# Patient Record
Sex: Female | Born: 1935 | Race: White | Hispanic: No | Smoking: Former smoker
Health system: Southern US, Community
[De-identification: ages and names within clinical notes are randomized; demographics above are authoritative.]

## PROBLEM LIST (undated history)

## (undated) DIAGNOSIS — J45909 Unspecified asthma, uncomplicated: Secondary | ICD-10-CM

## (undated) DIAGNOSIS — S72009A Fracture of unspecified part of neck of unspecified femur, initial encounter for closed fracture: Secondary | ICD-10-CM

## (undated) DIAGNOSIS — J449 Chronic obstructive pulmonary disease, unspecified: Secondary | ICD-10-CM

## (undated) DIAGNOSIS — W19XXXA Unspecified fall, initial encounter: Secondary | ICD-10-CM

## (undated) DIAGNOSIS — E119 Type 2 diabetes mellitus without complications: Secondary | ICD-10-CM

## (undated) DIAGNOSIS — D649 Anemia, unspecified: Secondary | ICD-10-CM

## (undated) DIAGNOSIS — M1990 Unspecified osteoarthritis, unspecified site: Secondary | ICD-10-CM

## (undated) DIAGNOSIS — I10 Essential (primary) hypertension: Secondary | ICD-10-CM

## (undated) DIAGNOSIS — E785 Hyperlipidemia, unspecified: Secondary | ICD-10-CM

## (undated) DIAGNOSIS — N179 Acute kidney failure, unspecified: Secondary | ICD-10-CM

## (undated) DIAGNOSIS — R7989 Other specified abnormal findings of blood chemistry: Secondary | ICD-10-CM

## (undated) DIAGNOSIS — N184 Chronic kidney disease, stage 4 (severe): Secondary | ICD-10-CM

## (undated) DIAGNOSIS — R778 Other specified abnormalities of plasma proteins: Secondary | ICD-10-CM

## (undated) DIAGNOSIS — C801 Malignant (primary) neoplasm, unspecified: Secondary | ICD-10-CM

## (undated) HISTORY — DX: Acute kidney failure, unspecified: N17.9

## (undated) HISTORY — DX: Other specified abnormalities of plasma proteins: R77.8

## (undated) HISTORY — PX: STENT PLACEMENT VASCULAR (ARMC HX): HXRAD1737

## (undated) HISTORY — DX: Hyperlipidemia, unspecified: E78.5

## (undated) HISTORY — DX: Anemia, unspecified: D64.9

## (undated) HISTORY — DX: Unspecified fall, initial encounter: W19.XXXA

## (undated) HISTORY — DX: Chronic kidney disease, stage 4 (severe): N18.4

## (undated) HISTORY — DX: Fracture of unspecified part of neck of unspecified femur, initial encounter for closed fracture: S72.009A

## (undated) HISTORY — DX: Other specified abnormal findings of blood chemistry: R79.89

## (undated) HISTORY — PX: CHOLECYSTECTOMY: SHX55

## (undated) HISTORY — PX: FEMORAL ARTERY STENT: SHX1583

## (undated) HISTORY — PX: REPLACEMENT TOTAL KNEE: SUR1224

## (undated) HISTORY — PX: CATARACT EXTRACTION: SUR2

---
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
## 1973-05-03 HISTORY — PX: ABDOMINAL HYSTERECTOMY: SHX81

## 2008-04-29 ENCOUNTER — Inpatient Hospital Stay (HOSPITAL_COMMUNITY): Admission: EM | Admit: 2008-04-29 | Discharge: 2008-05-01 | Payer: Self-pay | Admitting: Family Medicine

## 2010-09-15 NOTE — Discharge Summary (Signed)
[name], [name]              ACCOUNT NO.:  [id number]   MEDICAL RECORD NO.:  [id number]          PATIENT TYPE:  INP   LOCATION:  4715                         FACILITY:  [facility]   PHYSICIAN:  Richarda Overlie, MD       DATE OF BIRTH:  12/18/1925   DATE OF ADMISSION:  04/29/2008  DATE OF DISCHARGE:  05/01/2008                               DISCHARGE SUMMARY   DISCHARGE DIAGNOSES:  1. Chronic obstructive pulmonary disease exacerbation.  2. Mild congestive heart failure.  3. Anemia.  4. Acute on chronic renal insufficiency.  5. Subtherapeutic INR with a history of recent deep vein thrombosis.   SUBJECTIVE:  This is an 77 year old female who presented to the ER with:  1. Chief complaint of shortness of breath, fairly acute in onset,      associated with nonproductive cough.  The patient has noted      worsening over the last one week.  The patient had pulmonary      embolism September 2009, he was found to have a subtherapeutic INR      and was admitted for adjustment of her INR and treatment of her      renal insufficiency and COPD exacerbation.  Initial chest x-ray      showed central pulmonary vascular prominence, elevation of the      right hemidiaphragm with increased markings at the right base and      some cardiomegaly.  No obvious infiltrate.  However, because of the      patient's tenuous pulmonary status  and COPD , she was treated with      moxifloxacin IV which was later switched to p.o. also.  Serial      troponins and EKG were done to rule out acute coronary syndrome      which remained negative.  Her BNP was only found to be 162.  She      was not found to be in any CHF exacerbation.  2. The patient also has a history of asthma and allergies, and      therefore, we started her on Singulair and Flagyl prior to      discharge.  3. Renal insufficiency.  The patient was found to have a creatinine of      1.7 which responded to IV hydration.  Her enalapril was held.  Her   Lasix was continued.  4. Leukocytosis secondary to steroids.  5. Hyperglycemia secondary to steroids.   DISPOSITION:  PT/OT consultation was obtained.  Ambulatory OT evaluation  was obtained.  Case manager to arrange for Lovenox injections for four  days.  Case manager also to arrange for PCP followup in 5-7 days as well  as INR check on May 12, 2008, and to set up with the Coumadin  clinic.   DISCHARGE MEDICATIONS:  1. Coumadin 5 mg daily.  2. Singulair 10 mg p.o. daily.  3. Prednisone 10 mg tablets, 6 tablets for four days, 5 tablets for      four days, 4 tablets for four days, 3 tablets for four days, 2  tablets for four days, one tablet for four days and then      discontinue.  4. Claritin 10 mg p.o. daily.  5. Lovenox 90 mg subcu q.12 h for four days.  6. Moxifloxacin 400 mg p.o. daily for four days.  7. Advair discus 500/50 one puff q.12 h.  8. Celexa 20 mg p.o. daily.  9. Darvocet N-100 mg q.8 h p.r.n.  10.Enalapril 20 mg b.i.d. (hold until 05/07/2008).  11.Garlic daily.  12.Iron over-the-counter b.i.d.  13.Klor-Con 10 mEq p.o. daily.  14.Labetalol 300 mg p.o. twice daily.  15.Lasix 20 mg p.o. daily.  16.Lovastatin 40 mg p.o. daily.  17.Lovaza one gram capsule b.i.d.  18.Metformin, hold until 05/07/2008.  19.Multivitamin one tablet daily.  20.Nitroglycerin 0.4 mg q.5 minutes p.r.n. x2 for chest pain.  21.Norvasc 2.5 mg p.o. daily.  22.Omeprazole 20 mg p.o. daily.  23.Plavix 75 mg p.o. daily.  24.Tylenol Extra Strength p.r.n. Ventolin.  25.HFA inhaler q.4-6 h p.r.n.      Richarda Overlie, MD  Electronically Signed     NA/MEDQ  D:  05/01/2008  T:  05/01/2008  Job:  [id number]

## 2010-09-15 NOTE — H&P (Signed)
[name], [name]              ACCOUNT NO.:  [id number]   MEDICAL RECORD NO.:  [id number]          PATIENT TYPE:  INP   LOCATION:  4715                         FACILITY:  [facility]   PHYSICIAN:  Della Goo, M.D. DATE OF BIRTH:  09/15/1925   DATE OF ADMISSION:  04/29/2008  DATE OF DISCHARGE:                              HISTORY & PHYSICAL   PRIMARY CARE PHYSICIAN:  Unassigned.  The patient is from out-of-town  visiting family.  She is from for [place].   CHIEF COMPLAINT:  Shortness of breath, cough.   HISTORY OF PRESENT ILLNESS:  This is an 77 year old female who presented  to the emergency department with complaints of cough, shortness of  breath and greenish phlegm for the past 3 days.  She states that she had  been given antibiotic therapy by her primary care physician prior to her  travel in case she needed this of a Z-Pak, however, her symptoms have  been worsening.  The patient states that she went to the emergency  department because of her symptoms, but also because she wanted to have  her Coumadin level checked.  The patient has a history of a pulmonary  embolism and has been on Coumadin therapy for this.  The patient does  report having chest pain discomfort and describes the pain as being  bilateral chest pain and does report having pleuritic pain.  Denies  having any hemoptysis.  The patient was evaluated in the emergency  department and found to have an elevated white blood cell count, also  found to have a subtherapeutic Coumadin level and was referred for  further treatment and therapy.  The patient was seen initially at an  [facility] and referred to the hospital.  The patient also had a  prednisone taper as an outpatient for symptoms as well.  She reports  only taking 2 days of this.   PAST MEDICAL HISTORY:  1. As mentioned above.  2. History of a pulmonary embolism.  3. History of hypertension.  4. Diastolic congestive heart failure.  5. Anemia.  6. Chronic renal insufficiency.  7. The patient also has a history of type 2 diabetes.  8. History of hyperlipidemia.  9. Gastroesophageal reflux disease.  10.The patient was diagnosed with a pulmonary embolism in July 2009.  11.The patient has a history of coronary artery disease.  12.Peripheral vascular disease.  13.History of reactive airway disease.   PAST SURGICAL HISTORY:  1. History of a total abdominal hysterectomy.  2. History of a cholecystectomy.   MEDICATIONS:  1. Coumadin 2 mg p.o. daily.  2. Plavix 75 mg 1 p.o. daily.  3. Hydrochlorothiazide 25 mg 1 p.o. daily or Lasix 20 mg 1 p.o. daily.  4. Klor-Con 8 mEq 1 tablet p.o. daily when taking Lasix.  5. Glyburide/metformin 5/500 one tablet p.o. b.i.d.  6. Labetalol 300 mg 1 p.o. b.i.d.  7. Enalapril 20 mg 1 p.o. b.i.d.  8. Lovastatin 10 mg 1 p.o. b.i.d.  9. Lovaza 1 gram 1 p.o. b.i.d.  10.Celexa 20 mg 1 p.o. daily.  11.Amlodipine 2.5 mg 1 p.o. daily.  12.Omeprazole  20 mg 1 p.o. daily.  13.Iron over-the-counter 1 tablet b.i.d.  14.Advair Diskus 500/50 one inhalation q.12 h.  15.Ventolin inhaler HFA 2 puffs daily p.r.n.  16.Darvocet N 100 one tablet p.o. q.6 h., p.r.n. pain.  17.Tylenol Extra-Strength as needed for pain.  18.Nitroglycerin 0.4 mg p.r.n. chest pain.  19.Over-the-counter garlic tablets.  20.B complex tablets.  21.Multivitamin tablet daily.   ALLERGIES:  1. PENICILLIN.  2. SULFA.   SOCIAL HISTORY:  The patient is a nonsmoker and nondrinker.   FAMILY HISTORY:  Noncontributory.   REVIEW OF SYSTEMS:  Pertinents are mentioned above.  The patient denies  having any nausea, vomiting, diarrhea, constipation, lightheadedness,  syncope, seizures, loss of appetite, weight loss or weight gain.  She  denies having any dysuria.  Pertinent positives are mentioned above, but  also the patient does report having fevers and chills off and on and  some myalgias.   PHYSICAL EXAMINATION:  GENERAL:  This is a  pleasant 77 year old younger  than stated age appearing obese female in discomfort, but no acute  distress.  VITAL SIGNS:  Temperature 99.7, blood pressure 130/76, heart rate 85,  respirations 28.  O2 saturations initially 88% on room air, now on 2  liters nasal cannula oxygen at 96%.  HEENT:  Normocephalic, atraumatic.  There is no scleral icterus or  conjunctival injection.  Pupils are equally round and reactive to light.  Extraocular movements are intact.  Funduscopic benign.  Nares are  patent.  Oropharynx is clear.  No exudates, erythema or tonsillar  adenopathy.  NECK:  Supple.  Full range of motion.  No thyromegaly, adenopathy or  jugulovenous distention.  CARDIOVASCULAR:  Regular rate and rhythm.  No  murmurs, gallops or rubs.  LUNGS:  Occasional rhonchi.  No rales, no wheezes.  ABDOMEN:  Positive bowel sounds.  Soft, nontender, nondistended.  No  hepatosplenomegaly.  EXTREMITIES:  Without cyanosis, clubbing or edema.  NEUROLOGIC:  Alert and oriented x3.  There are no focal deficits.  Motor  and sensory function are intact.  Cerebellar function intact as well.   LABORATORY STUDIES:  White blood cell count 11.8, hemoglobin 10.6,  hematocrit 32.6, platelets 179, neutrophils 84%, lymphocytes 11%.  Sodium 137, potassium 4.0, chloride 99, bicarb 33, BUN 24, creatinine  1.7 and glucose 137.  Beta natriuretic peptide 162.  Protime 15.8, INR  1.2.  Chest x-ray performed reveals an elevated right hemidiaphragm with  increased markings in the right base and cardiomegaly is present.  Central pulmonary venous prominence also present.  No evidence of  pulmonary edema.  An EKG was performed which did reveal a normal sinus  rhythm without acute ST-segment changes.   ASSESSMENT:  An 77 year old female being admitted with;  1. Shortness of breath.  2. Early pneumonia versus bronchitis.  3. Mild congestive heart failure syndrome chronically.  4. Subtherapeutic Coumadin level.  5. Anemia.   6. Chronic renal insufficiency.   PLAN:  The patient will be admitted and placed on IV antibiotic therapy  of Avelox with an IV steroid taper and nebulizer treatments along with  supplemental oxygen therapy.  Cardiac enzymes will also be started.  Full-dose Lovenox therapy will be initiated to bridge for adjustment in  her Coumadin therapy.  The patient's electrolytes, protime and INR will  be monitored.  GI prophylaxis has been ordered and the patient's regular  medications will be further verified and continued as well.      Della Goo, M.D.  Electronically Signed  HJ/MEDQ  D:  05/01/2008  T:  05/01/2008  Job:  [id number]

## 2011-02-05 LAB — BASIC METABOLIC PANEL
BUN: 26 mg/dL — ABNORMAL HIGH (ref 6–23)
Calcium: 8.3 mg/dL — ABNORMAL LOW (ref 8.4–10.5)
Calcium: 8.9 mg/dL (ref 8.4–10.5)
Creatinine, Ser: 1.27 mg/dL — ABNORMAL HIGH (ref 0.4–1.2)
Creatinine, Ser: 1.46 mg/dL — ABNORMAL HIGH (ref 0.4–1.2)
GFR calc non Af Amer: 34 mL/min — ABNORMAL LOW (ref >60)
GFR calc non Af Amer: 40 mL/min — ABNORMAL LOW (ref >60)
Glucose, Bld: 225 mg/dL — ABNORMAL HIGH (ref 70–99)
Glucose, Bld: 450 mg/dL — ABNORMAL HIGH (ref 70–99)
Potassium: 4.2 mEq/L (ref 3.5–5.1)
Sodium: 137 mEq/L (ref 135–145)

## 2011-02-05 LAB — POCT I-STAT, CHEM 8
BUN: 24 mg/dL — ABNORMAL HIGH (ref 6–23)
Calcium, Ion: 1.05 mmol/L — ABNORMAL LOW (ref 1.12–1.32)
Chloride: 99 mEq/L (ref 96–112)
Glucose, Bld: 137 mg/dL — ABNORMAL HIGH (ref 70–99)
Potassium: 4 mEq/L (ref 3.5–5.1)

## 2011-02-05 LAB — PROTIME-INR
INR: 1.2 (ref 0.00–1.49)
Prothrombin Time: 15.8 seconds — ABNORMAL HIGH (ref 11.6–15.2)
Prothrombin Time: 16 seconds — ABNORMAL HIGH (ref 11.6–15.2)

## 2011-02-05 LAB — DIFFERENTIAL
Basophils Absolute: 0 10*3/uL (ref 0.0–0.1)
Basophils Relative: 0 % (ref 0–1)
Basophils Relative: 0 % (ref 0–1)
Eosinophils Absolute: 0.1 10*3/uL (ref 0.0–0.7)
Eosinophils Relative: 0 % (ref 0–5)
Lymphocytes Relative: 7 % — ABNORMAL LOW (ref 12–46)
Monocytes Absolute: 0 10*3/uL — ABNORMAL LOW (ref 0.1–1.0)
Monocytes Absolute: 0.6 10*3/uL (ref 0.1–1.0)
Monocytes Relative: 5 % (ref 3–12)
Neutro Abs: 7.6 10*3/uL (ref 1.7–7.7)
Neutrophils Relative %: 93 % — ABNORMAL HIGH (ref 43–77)

## 2011-02-05 LAB — HEMOGLOBIN A1C
Hgb A1c MFr Bld: 6.6 % — ABNORMAL HIGH (ref 4.6–6.1)
Mean Plasma Glucose: 143 mg/dL

## 2011-02-05 LAB — CBC
HCT: 31.2 % — ABNORMAL LOW (ref 36.0–46.0)
HCT: 32.6 % — ABNORMAL LOW (ref 36.0–46.0)
Hemoglobin: 10 g/dL — ABNORMAL LOW (ref 12.0–15.0)
Hemoglobin: 10.6 g/dL — ABNORMAL LOW (ref 12.0–15.0)
MCHC: 32.4 g/dL (ref 30.0–36.0)
MCHC: 32.9 g/dL (ref 30.0–36.0)
MCV: 86.4 fL (ref 78.0–100.0)
Platelets: 157 10*3/uL (ref 150–400)
Platelets: 178 10*3/uL (ref 150–400)
RBC: 3.77 MIL/uL — ABNORMAL LOW (ref 3.87–5.11)
RDW: 19.1 % — ABNORMAL HIGH (ref 11.5–15.5)
RDW: 19.3 % — ABNORMAL HIGH (ref 11.5–15.5)

## 2011-02-05 LAB — GLUCOSE, CAPILLARY
Glucose-Capillary: 163 mg/dL — ABNORMAL HIGH (ref 70–99)
Glucose-Capillary: 211 mg/dL — ABNORMAL HIGH (ref 70–99)
Glucose-Capillary: 212 mg/dL — ABNORMAL HIGH (ref 70–99)
Glucose-Capillary: 234 mg/dL — ABNORMAL HIGH (ref 70–99)
Glucose-Capillary: 319 mg/dL — ABNORMAL HIGH (ref 70–99)
Glucose-Capillary: 329 mg/dL — ABNORMAL HIGH (ref 70–99)
Glucose-Capillary: 357 mg/dL — ABNORMAL HIGH (ref 70–99)
Glucose-Capillary: 373 mg/dL — ABNORMAL HIGH (ref 70–99)

## 2011-02-05 LAB — CK TOTAL AND CKMB (NOT AT ARMC)
CK, MB: 0.8 ng/mL (ref 0.3–4.0)
Relative Index: INVALID (ref 0.0–2.5)

## 2011-02-05 LAB — TSH: TSH: 1.573 u[IU]/mL (ref 0.350–4.500)

## 2011-02-05 LAB — CARDIAC PANEL(CRET KIN+CKTOT+MB+TROPI)
Relative Index: INVALID (ref 0.0–2.5)
Total CK: 45 U/L (ref 7–177)
Troponin I: 0.03 ng/mL (ref 0.00–0.06)

## 2013-05-03 HISTORY — PX: BREAST LUMPECTOMY: SHX2

## 2015-12-02 DIAGNOSIS — W19XXXA Unspecified fall, initial encounter: Secondary | ICD-10-CM

## 2015-12-02 HISTORY — DX: Unspecified fall, initial encounter: W19.XXXA

## 2015-12-08 ENCOUNTER — Inpatient Hospital Stay (HOSPITAL_COMMUNITY)
Admission: EM | Admit: 2015-12-08 | Discharge: 2015-12-17 | DRG: 480 | Disposition: A | Discharge status: Skilled Nursing Facility | Payer: Medicare Other | Attending: Internal Medicine | Admitting: Internal Medicine

## 2015-12-08 DIAGNOSIS — N183 Chronic kidney disease, stage 3 (moderate): Secondary | ICD-10-CM

## 2015-12-08 DIAGNOSIS — S72453A Displaced supracondylar fracture without intracondylar extension of lower end of unspecified femur, initial encounter for closed fracture: Secondary | ICD-10-CM | POA: Diagnosis present

## 2015-12-08 DIAGNOSIS — Z9981 Dependence on supplemental oxygen: Secondary | ICD-10-CM

## 2015-12-08 DIAGNOSIS — K5900 Constipation, unspecified: Secondary | ICD-10-CM | POA: Diagnosis not present

## 2015-12-08 DIAGNOSIS — J410 Simple chronic bronchitis: Secondary | ICD-10-CM | POA: Diagnosis not present

## 2015-12-08 DIAGNOSIS — W010XXA Fall on same level from slipping, tripping and stumbling without subsequent striking against object, initial encounter: Secondary | ICD-10-CM | POA: Diagnosis present

## 2015-12-08 DIAGNOSIS — Z7902 Long term (current) use of antithrombotics/antiplatelets: Secondary | ICD-10-CM

## 2015-12-08 DIAGNOSIS — N289 Disorder of kidney and ureter, unspecified: Secondary | ICD-10-CM

## 2015-12-08 DIAGNOSIS — W19XXXA Unspecified fall, initial encounter: Secondary | ICD-10-CM

## 2015-12-08 DIAGNOSIS — Z955 Presence of coronary angioplasty implant and graft: Secondary | ICD-10-CM

## 2015-12-08 DIAGNOSIS — I10 Essential (primary) hypertension: Secondary | ICD-10-CM

## 2015-12-08 DIAGNOSIS — E1122 Type 2 diabetes mellitus with diabetic chronic kidney disease: Secondary | ICD-10-CM | POA: Diagnosis present

## 2015-12-08 DIAGNOSIS — E1151 Type 2 diabetes mellitus with diabetic peripheral angiopathy without gangrene: Secondary | ICD-10-CM | POA: Diagnosis present

## 2015-12-08 DIAGNOSIS — J811 Chronic pulmonary edema: Secondary | ICD-10-CM | POA: Diagnosis not present

## 2015-12-08 DIAGNOSIS — I248 Other forms of acute ischemic heart disease: Secondary | ICD-10-CM | POA: Diagnosis not present

## 2015-12-08 DIAGNOSIS — R0682 Tachypnea, not elsewhere classified: Secondary | ICD-10-CM

## 2015-12-08 DIAGNOSIS — J9621 Acute and chronic respiratory failure with hypoxia: Secondary | ICD-10-CM

## 2015-12-08 DIAGNOSIS — J4520 Mild intermittent asthma, uncomplicated: Secondary | ICD-10-CM

## 2015-12-08 DIAGNOSIS — D62 Acute posthemorrhagic anemia: Secondary | ICD-10-CM | POA: Diagnosis not present

## 2015-12-08 DIAGNOSIS — Z09 Encounter for follow-up examination after completed treatment for conditions other than malignant neoplasm: Secondary | ICD-10-CM

## 2015-12-08 DIAGNOSIS — N189 Chronic kidney disease, unspecified: Secondary | ICD-10-CM

## 2015-12-08 DIAGNOSIS — Z87891 Personal history of nicotine dependence: Secondary | ICD-10-CM

## 2015-12-08 DIAGNOSIS — Y92009 Unspecified place in unspecified non-institutional (private) residence as the place of occurrence of the external cause: Secondary | ICD-10-CM

## 2015-12-08 DIAGNOSIS — Z881 Allergy status to other antibiotic agents status: Secondary | ICD-10-CM

## 2015-12-08 DIAGNOSIS — Z880 Allergy status to penicillin: Secondary | ICD-10-CM

## 2015-12-08 DIAGNOSIS — E785 Hyperlipidemia, unspecified: Secondary | ICD-10-CM | POA: Diagnosis present

## 2015-12-08 DIAGNOSIS — S7291XA Unspecified fracture of right femur, initial encounter for closed fracture: Secondary | ICD-10-CM | POA: Diagnosis present

## 2015-12-08 DIAGNOSIS — Z79899 Other long term (current) drug therapy: Secondary | ICD-10-CM

## 2015-12-08 DIAGNOSIS — E119 Type 2 diabetes mellitus without complications: Secondary | ICD-10-CM

## 2015-12-08 DIAGNOSIS — S72401A Unspecified fracture of lower end of right femur, initial encounter for closed fracture: Secondary | ICD-10-CM

## 2015-12-08 DIAGNOSIS — I2510 Atherosclerotic heart disease of native coronary artery without angina pectoris: Secondary | ICD-10-CM

## 2015-12-08 DIAGNOSIS — M9711XA Periprosthetic fracture around internal prosthetic right knee joint, initial encounter: Principal | ICD-10-CM | POA: Diagnosis present

## 2015-12-08 DIAGNOSIS — Z419 Encounter for procedure for purposes other than remedying health state, unspecified: Secondary | ICD-10-CM

## 2015-12-08 DIAGNOSIS — N184 Chronic kidney disease, stage 4 (severe): Secondary | ICD-10-CM | POA: Diagnosis present

## 2015-12-08 DIAGNOSIS — I1310 Hypertensive heart and chronic kidney disease without heart failure, with stage 1 through stage 4 chronic kidney disease, or unspecified chronic kidney disease: Secondary | ICD-10-CM | POA: Diagnosis present

## 2015-12-08 DIAGNOSIS — J449 Chronic obstructive pulmonary disease, unspecified: Secondary | ICD-10-CM | POA: Diagnosis present

## 2015-12-08 DIAGNOSIS — N179 Acute kidney failure, unspecified: Secondary | ICD-10-CM

## 2015-12-08 DIAGNOSIS — N1831 Chronic kidney disease, stage 3a: Secondary | ICD-10-CM

## 2015-12-08 DIAGNOSIS — S72451A Displaced supracondylar fracture without intracondylar extension of lower end of right femur, initial encounter for closed fracture: Secondary | ICD-10-CM | POA: Diagnosis present

## 2015-12-08 HISTORY — DX: Type 2 diabetes mellitus without complications: E11.9

## 2015-12-08 HISTORY — DX: Malignant (primary) neoplasm, unspecified: C80.1

## 2015-12-08 HISTORY — DX: Unspecified asthma, uncomplicated: J45.909

## 2015-12-08 HISTORY — DX: Essential (primary) hypertension: I10

## 2015-12-08 HISTORY — DX: Chronic obstructive pulmonary disease, unspecified: J44.9

## 2015-12-08 HISTORY — DX: Unspecified osteoarthritis, unspecified site: M19.90

## 2015-12-08 NOTE — ED Notes (Signed)
Bed: WA08 Expected date:  Expected time:  Means of arrival:  Comments: EMS 

## 2015-12-08 NOTE — ED Notes (Signed)
Pt is from [facility] visiting with her family and was trying to turn off a light switch and stumbled over her feet and landed on her knees.  Swelling noted to right knee.  Pt states she also had a knee replacement.  She also is wearing a cam boot on the right foot due to a broken "foot bone".

## 2015-12-09 ENCOUNTER — Inpatient Hospital Stay (HOSPITAL_COMMUNITY): Payer: Medicare Other

## 2015-12-09 ENCOUNTER — Inpatient Hospital Stay (HOSPITAL_COMMUNITY): Payer: Medicare Other | Admitting: Certified Registered Nurse Anesthetist

## 2015-12-09 ENCOUNTER — Emergency Department (HOSPITAL_COMMUNITY): Payer: Medicare Other

## 2015-12-09 ENCOUNTER — Encounter (HOSPITAL_COMMUNITY)
Admission: EM | Disposition: A | Discharge status: Skilled Nursing Facility | Payer: Self-pay | Source: Home / Self Care | Attending: Family Medicine

## 2015-12-09 ENCOUNTER — Encounter (HOSPITAL_COMMUNITY): Payer: Self-pay | Admitting: Emergency Medicine

## 2015-12-09 DIAGNOSIS — S7291XA Unspecified fracture of right femur, initial encounter for closed fracture: Secondary | ICD-10-CM | POA: Diagnosis present

## 2015-12-09 DIAGNOSIS — K5900 Constipation, unspecified: Secondary | ICD-10-CM | POA: Diagnosis not present

## 2015-12-09 DIAGNOSIS — I248 Other forms of acute ischemic heart disease: Secondary | ICD-10-CM | POA: Diagnosis not present

## 2015-12-09 DIAGNOSIS — E1122 Type 2 diabetes mellitus with diabetic chronic kidney disease: Secondary | ICD-10-CM | POA: Diagnosis present

## 2015-12-09 DIAGNOSIS — S72001A Fracture of unspecified part of neck of right femur, initial encounter for closed fracture: Secondary | ICD-10-CM

## 2015-12-09 DIAGNOSIS — I2510 Atherosclerotic heart disease of native coronary artery without angina pectoris: Secondary | ICD-10-CM | POA: Diagnosis present

## 2015-12-09 DIAGNOSIS — J449 Chronic obstructive pulmonary disease, unspecified: Secondary | ICD-10-CM | POA: Diagnosis present

## 2015-12-09 DIAGNOSIS — M9711XA Periprosthetic fracture around internal prosthetic right knee joint, initial encounter: Secondary | ICD-10-CM | POA: Diagnosis present

## 2015-12-09 DIAGNOSIS — Z87891 Personal history of nicotine dependence: Secondary | ICD-10-CM | POA: Diagnosis not present

## 2015-12-09 DIAGNOSIS — N183 Chronic kidney disease, stage 3 (moderate): Secondary | ICD-10-CM | POA: Diagnosis not present

## 2015-12-09 DIAGNOSIS — N179 Acute kidney failure, unspecified: Secondary | ICD-10-CM | POA: Diagnosis not present

## 2015-12-09 DIAGNOSIS — J45909 Unspecified asthma, uncomplicated: Secondary | ICD-10-CM | POA: Insufficient documentation

## 2015-12-09 DIAGNOSIS — I10 Essential (primary) hypertension: Secondary | ICD-10-CM | POA: Diagnosis not present

## 2015-12-09 DIAGNOSIS — Z7902 Long term (current) use of antithrombotics/antiplatelets: Secondary | ICD-10-CM | POA: Diagnosis not present

## 2015-12-09 DIAGNOSIS — Z79899 Other long term (current) drug therapy: Secondary | ICD-10-CM | POA: Diagnosis not present

## 2015-12-09 DIAGNOSIS — S72309D Unspecified fracture of shaft of unspecified femur, subsequent encounter for closed fracture with routine healing: Secondary | ICD-10-CM | POA: Diagnosis not present

## 2015-12-09 DIAGNOSIS — J811 Chronic pulmonary edema: Secondary | ICD-10-CM | POA: Diagnosis not present

## 2015-12-09 DIAGNOSIS — E119 Type 2 diabetes mellitus without complications: Secondary | ICD-10-CM

## 2015-12-09 DIAGNOSIS — Z881 Allergy status to other antibiotic agents status: Secondary | ICD-10-CM | POA: Diagnosis not present

## 2015-12-09 DIAGNOSIS — N189 Chronic kidney disease, unspecified: Secondary | ICD-10-CM | POA: Diagnosis not present

## 2015-12-09 DIAGNOSIS — I1310 Hypertensive heart and chronic kidney disease without heart failure, with stage 1 through stage 4 chronic kidney disease, or unspecified chronic kidney disease: Secondary | ICD-10-CM | POA: Diagnosis present

## 2015-12-09 DIAGNOSIS — N184 Chronic kidney disease, stage 4 (severe): Secondary | ICD-10-CM | POA: Diagnosis present

## 2015-12-09 DIAGNOSIS — E785 Hyperlipidemia, unspecified: Secondary | ICD-10-CM | POA: Diagnosis present

## 2015-12-09 DIAGNOSIS — D62 Acute posthemorrhagic anemia: Secondary | ICD-10-CM | POA: Diagnosis not present

## 2015-12-09 DIAGNOSIS — J4520 Mild intermittent asthma, uncomplicated: Secondary | ICD-10-CM | POA: Insufficient documentation

## 2015-12-09 DIAGNOSIS — J9621 Acute and chronic respiratory failure with hypoxia: Secondary | ICD-10-CM | POA: Diagnosis present

## 2015-12-09 DIAGNOSIS — S72453A Displaced supracondylar fracture without intracondylar extension of lower end of unspecified femur, initial encounter for closed fracture: Secondary | ICD-10-CM | POA: Diagnosis present

## 2015-12-09 DIAGNOSIS — E1151 Type 2 diabetes mellitus with diabetic peripheral angiopathy without gangrene: Secondary | ICD-10-CM | POA: Diagnosis present

## 2015-12-09 DIAGNOSIS — N1831 Chronic kidney disease, stage 3a: Secondary | ICD-10-CM

## 2015-12-09 DIAGNOSIS — J810 Acute pulmonary edema: Secondary | ICD-10-CM | POA: Diagnosis not present

## 2015-12-09 DIAGNOSIS — S72451A Displaced supracondylar fracture without intracondylar extension of lower end of right femur, initial encounter for closed fracture: Secondary | ICD-10-CM | POA: Diagnosis present

## 2015-12-09 DIAGNOSIS — Z880 Allergy status to penicillin: Secondary | ICD-10-CM | POA: Diagnosis not present

## 2015-12-09 DIAGNOSIS — Z955 Presence of coronary angioplasty implant and graft: Secondary | ICD-10-CM | POA: Diagnosis not present

## 2015-12-09 DIAGNOSIS — Z9981 Dependence on supplemental oxygen: Secondary | ICD-10-CM | POA: Diagnosis not present

## 2015-12-09 DIAGNOSIS — S72451D Displaced supracondylar fracture without intracondylar extension of lower end of right femur, subsequent encounter for closed fracture with routine healing: Secondary | ICD-10-CM | POA: Diagnosis not present

## 2015-12-09 DIAGNOSIS — J410 Simple chronic bronchitis: Secondary | ICD-10-CM | POA: Diagnosis present

## 2015-12-09 DIAGNOSIS — W010XXA Fall on same level from slipping, tripping and stumbling without subsequent striking against object, initial encounter: Secondary | ICD-10-CM | POA: Diagnosis present

## 2015-12-09 HISTORY — PX: TOTAL KNEE ARTHROPLASTY: SHX125

## 2015-12-09 LAB — URINALYSIS, ROUTINE W REFLEX MICROSCOPIC
Bilirubin Urine: NEGATIVE
GLUCOSE, UA: NEGATIVE mg/dL
Hgb urine dipstick: NEGATIVE
KETONES UR: NEGATIVE mg/dL
Nitrite: NEGATIVE
Protein, ur: NEGATIVE mg/dL
Specific Gravity, Urine: 1.014 (ref 1.005–1.030)
pH: 5 (ref 5.0–8.0)

## 2015-12-09 LAB — SURGICAL PCR SCREEN
MRSA, PCR: NEGATIVE
STAPHYLOCOCCUS AUREUS: NEGATIVE

## 2015-12-09 LAB — ABO/RH
ABO/RH(D): O POS
ABO/RH(D): O POS

## 2015-12-09 LAB — CBC WITH DIFFERENTIAL/PLATELET
BASOS ABS: 0 10*3/uL (ref 0.0–0.1)
BASOS PCT: 0 %
Eosinophils Absolute: 0.1 10*3/uL (ref 0.0–0.7)
Eosinophils Relative: 1 %
HCT: 36.3 % (ref 36.0–46.0)
Hemoglobin: 11.8 g/dL — ABNORMAL LOW (ref 12.0–15.0)
LYMPHS PCT: 11 %
Lymphs Abs: 1 10*3/uL (ref 0.7–4.0)
MCH: 28.7 pg (ref 26.0–34.0)
MCHC: 32.5 g/dL (ref 30.0–36.0)
MCV: 88.3 fL (ref 78.0–100.0)
MONO ABS: 0.5 10*3/uL (ref 0.1–1.0)
Monocytes Relative: 6 %
NEUTROS ABS: 7.3 10*3/uL (ref 1.7–7.7)
Neutrophils Relative %: 82 %
Platelets: 150 10*3/uL (ref 150–400)
RBC: 4.11 MIL/uL (ref 3.87–5.11)
RDW: 14.4 % (ref 11.5–15.5)
WBC: 8.9 10*3/uL (ref 4.0–10.5)

## 2015-12-09 LAB — TYPE AND SCREEN
ABO/RH(D): O POS
ABO/RH(D): O POS
ANTIBODY SCREEN: NEGATIVE
ANTIBODY SCREEN: NEGATIVE

## 2015-12-09 LAB — CBG MONITORING, ED
Glucose-Capillary: 139 mg/dL — ABNORMAL HIGH (ref 65–99)
Glucose-Capillary: 130 mg/dL — ABNORMAL HIGH (ref 65–99)

## 2015-12-09 LAB — BASIC METABOLIC PANEL
ANION GAP: 9 (ref 5–15)
BUN: 40 mg/dL — AB (ref 6–20)
CALCIUM: 8.8 mg/dL — AB (ref 8.9–10.3)
CHLORIDE: 103 mmol/L (ref 101–111)
CO2: 25 mmol/L (ref 22–32)
Creatinine, Ser: 2.04 mg/dL — ABNORMAL HIGH (ref 0.44–1.00)
GFR calc Af Amer: 24 mL/min — ABNORMAL LOW (ref >60)
GFR, EST NON AFRICAN AMERICAN: 21 mL/min — AB (ref >60)
GLUCOSE: 182 mg/dL — AB (ref 65–99)
POTASSIUM: 4.7 mmol/L (ref 3.5–5.1)
Sodium: 137 mmol/L (ref 135–145)

## 2015-12-09 LAB — PROTIME-INR
INR: 0.97
PROTHROMBIN TIME: 12.9 s (ref 11.4–15.2)

## 2015-12-09 LAB — APTT: aPTT: 32 seconds (ref 24–36)

## 2015-12-09 LAB — GLUCOSE, CAPILLARY
Glucose-Capillary: 132 mg/dL — ABNORMAL HIGH (ref 65–99)
Glucose-Capillary: 164 mg/dL — ABNORMAL HIGH (ref 65–99)

## 2015-12-09 LAB — URINE MICROSCOPIC-ADD ON: RBC / HPF: NONE SEEN RBC/hpf (ref 0–5)

## 2015-12-09 SURGERY — ARTHROPLASTY, KNEE, TOTAL
Anesthesia: General | Site: Leg Upper | Laterality: Right

## 2015-12-09 MED ORDER — BUDESONIDE 0.25 MG/2ML IN SUSP
0.2500 mg | Freq: Every day | RESPIRATORY_TRACT | Status: DC
  Administered 2015-12-09 – 2015-12-17 (×8): 0.25 mg via RESPIRATORY_TRACT
  Filled 2015-12-09 (×10): qty 2

## 2015-12-09 MED ORDER — PROPOFOL 10 MG/ML IV BOLUS
INTRAVENOUS | Status: DC | PRN
  Administered 2015-12-09: 100 mg via INTRAVENOUS

## 2015-12-09 MED ORDER — LACTATED RINGERS IV SOLN: INTRAVENOUS | Status: DC

## 2015-12-09 MED ORDER — NITROGLYCERIN 0.4 MG SL SUBL
0.4000 mg | SUBLINGUAL_TABLET | SUBLINGUAL | Status: DC | PRN
Start: 2015-12-09 — End: 2015-12-17

## 2015-12-09 MED ORDER — METHOCARBAMOL 1000 MG/10ML IJ SOLN: 500.0000 mg | Freq: Once | INTRAMUSCULAR | Status: DC

## 2015-12-09 MED ORDER — PRAVASTATIN SODIUM 10 MG PO TABS
20.0000 mg | ORAL_TABLET | Freq: Every day | ORAL | Status: DC
  Administered 2015-12-10 – 2015-12-16 (×7): 20 mg via ORAL
  Filled 2015-12-09 (×7): qty 1

## 2015-12-09 MED ORDER — MORPHINE SULFATE (PF) 4 MG/ML IV SOLN
4.0000 mg | Freq: Once | INTRAVENOUS | Status: AC
  Administered 2015-12-09: 4 mg via INTRAVENOUS
  Filled 2015-12-09: qty 1

## 2015-12-09 MED ORDER — ROCURONIUM BROMIDE 10 MG/ML (PF) SYRINGE
PREFILLED_SYRINGE | INTRAVENOUS | Status: AC
  Filled 2015-12-09: qty 10

## 2015-12-09 MED ORDER — CLINDAMYCIN PHOSPHATE 600 MG/50ML IV SOLN
600.0000 mg | Freq: Four times a day (QID) | INTRAVENOUS | Status: AC
  Administered 2015-12-10 (×2): 600 mg via INTRAVENOUS
  Filled 2015-12-09 (×2): qty 50

## 2015-12-09 MED ORDER — LIDOCAINE 2% (20 MG/ML) 5 ML SYRINGE
INTRAMUSCULAR | Status: DC | PRN
  Administered 2015-12-09: 100 mg via INTRAVENOUS

## 2015-12-09 MED ORDER — METOPROLOL TARTRATE 5 MG/5ML IV SOLN: 5.0000 mg | INTRAVENOUS | Status: DC | PRN

## 2015-12-09 MED ORDER — CLINDAMYCIN PHOSPHATE 900 MG/50ML IV SOLN
INTRAVENOUS | Status: AC
  Filled 2015-12-09: qty 50

## 2015-12-09 MED ORDER — MAGNESIUM CITRATE PO SOLN: 1.0000 | Freq: Once | ORAL | Status: DC | PRN

## 2015-12-09 MED ORDER — ONDANSETRON HCL 4 MG/2ML IJ SOLN: 4.0000 mg | Freq: Four times a day (QID) | INTRAMUSCULAR | Status: DC | PRN

## 2015-12-09 MED ORDER — ACETAMINOPHEN 325 MG PO TABS: 325.0000 mg | ORAL_TABLET | ORAL | Status: DC | PRN

## 2015-12-09 MED ORDER — ACETAMINOPHEN 160 MG/5ML PO SOLN
325.0000 mg | ORAL | Status: DC | PRN
  Filled 2015-12-09: qty 20.3

## 2015-12-09 MED ORDER — FENTANYL CITRATE (PF) 250 MCG/5ML IJ SOLN
INTRAMUSCULAR | Status: AC
  Filled 2015-12-09: qty 5

## 2015-12-09 MED ORDER — ARFORMOTEROL TARTRATE 15 MCG/2ML IN NEBU
15.0000 ug | INHALATION_SOLUTION | Freq: Two times a day (BID) | RESPIRATORY_TRACT | Status: DC
  Administered 2015-12-09 – 2015-12-17 (×14): 15 ug via RESPIRATORY_TRACT
  Filled 2015-12-09 (×16): qty 2

## 2015-12-09 MED ORDER — EPHEDRINE SULFATE 50 MG/ML IJ SOLN
INTRAMUSCULAR | Status: DC | PRN
  Administered 2015-12-09 (×5): 10 mg via INTRAVENOUS

## 2015-12-09 MED ORDER — HYDRALAZINE HCL 20 MG/ML IJ SOLN
10.0000 mg | Freq: Four times a day (QID) | INTRAMUSCULAR | Status: DC | PRN
  Filled 2015-12-09: qty 1

## 2015-12-09 MED ORDER — MORPHINE SULFATE (PF) 2 MG/ML IV SOLN
2.0000 mg | INTRAVENOUS | Status: DC | PRN
Start: 2015-12-09 — End: 2015-12-09
  Administered 2015-12-09 (×2): 2 mg via INTRAVENOUS
  Filled 2015-12-09 (×2): qty 1

## 2015-12-09 MED ORDER — ONDANSETRON HCL 4 MG/2ML IJ SOLN
4.0000 mg | Freq: Once | INTRAMUSCULAR | Status: AC
Start: 2015-12-09 — End: 2015-12-09
  Administered 2015-12-09: 4 mg via INTRAVENOUS
  Filled 2015-12-09: qty 2

## 2015-12-09 MED ORDER — SUCCINYLCHOLINE CHLORIDE 200 MG/10ML IV SOSY
PREFILLED_SYRINGE | INTRAVENOUS | Status: DC | PRN
  Administered 2015-12-09: 100 mg via INTRAVENOUS

## 2015-12-09 MED ORDER — SODIUM CHLORIDE 0.9 % IV SOLN
INTRAVENOUS | Status: DC
  Administered 2015-12-09: 17:00:00 via INTRAVENOUS

## 2015-12-09 MED ORDER — ONDANSETRON HCL 4 MG/2ML IJ SOLN
INTRAMUSCULAR | Status: AC
  Filled 2015-12-09: qty 2

## 2015-12-09 MED ORDER — MENTHOL 3 MG MT LOZG: 1.0000 | LOZENGE | OROMUCOSAL | Status: DC | PRN

## 2015-12-09 MED ORDER — ONDANSETRON HCL 4 MG/2ML IJ SOLN
4.0000 mg | Freq: Once | INTRAMUSCULAR | Status: AC
  Administered 2015-12-09: 4 mg via INTRAVENOUS
  Filled 2015-12-09: qty 2

## 2015-12-09 MED ORDER — METOCLOPRAMIDE HCL 5 MG/ML IJ SOLN: 5.0000 mg | Freq: Three times a day (TID) | INTRAMUSCULAR | Status: DC | PRN

## 2015-12-09 MED ORDER — FENTANYL CITRATE (PF) 100 MCG/2ML IJ SOLN: 25.0000 ug | INTRAMUSCULAR | Status: DC | PRN

## 2015-12-09 MED ORDER — ONDANSETRON HCL 4 MG PO TABS: 4.0000 mg | ORAL_TABLET | Freq: Four times a day (QID) | ORAL | Status: DC | PRN

## 2015-12-09 MED ORDER — VANCOMYCIN HCL IN DEXTROSE 1-5 GM/200ML-% IV SOLN
1000.0000 mg | INTRAVENOUS | Status: AC
Start: 2015-12-10 — End: 2015-12-09
  Administered 2015-12-09: 1000 mg via INTRAVENOUS
  Filled 2015-12-09: qty 200

## 2015-12-09 MED ORDER — METOCLOPRAMIDE HCL 5 MG PO TABS
5.0000 mg | ORAL_TABLET | Freq: Three times a day (TID) | ORAL | Status: DC | PRN
Start: 2015-12-09 — End: 2015-12-17

## 2015-12-09 MED ORDER — DEXTROSE-NACL 5-0.45 % IV SOLN
INTRAVENOUS | Status: AC
  Administered 2015-12-09: 16:00:00 via INTRAVENOUS

## 2015-12-09 MED ORDER — PHENYLEPHRINE HCL 10 MG/ML IJ SOLN
INTRAMUSCULAR | Status: DC | PRN
  Administered 2015-12-09 (×2): 80 ug via INTRAVENOUS

## 2015-12-09 MED ORDER — NIFEDIPINE ER OSMOTIC RELEASE 30 MG PO TB24
30.0000 mg | ORAL_TABLET | Freq: Two times a day (BID) | ORAL | Status: DC
  Administered 2015-12-11 – 2015-12-17 (×13): 30 mg via ORAL
  Filled 2015-12-09 (×18): qty 1

## 2015-12-09 MED ORDER — ENOXAPARIN SODIUM 30 MG/0.3ML ~~LOC~~ SOLN
30.0000 mg | SUBCUTANEOUS | Status: DC
  Administered 2015-12-10 – 2015-12-17 (×8): 30 mg via SUBCUTANEOUS
  Filled 2015-12-09 (×8): qty 0.3

## 2015-12-09 MED ORDER — LIDOCAINE 2% (20 MG/ML) 5 ML SYRINGE
INTRAMUSCULAR | Status: AC
  Filled 2015-12-09: qty 5

## 2015-12-09 MED ORDER — CLINDAMYCIN PHOSPHATE 900 MG/50ML IV SOLN
900.0000 mg | INTRAVENOUS | Status: AC
  Administered 2015-12-09: 900 mg via INTRAVENOUS
  Filled 2015-12-09: qty 50

## 2015-12-09 MED ORDER — HYDROCODONE-ACETAMINOPHEN 5-325 MG PO TABS: 1.0000 | ORAL_TABLET | Freq: Four times a day (QID) | ORAL | Status: DC | PRN

## 2015-12-09 MED ORDER — FUROSEMIDE 40 MG PO TABS
40.0000 mg | ORAL_TABLET | Freq: Every day | ORAL | Status: DC
  Administered 2015-12-11 – 2015-12-12 (×2): 40 mg via ORAL
  Filled 2015-12-09 (×3): qty 1

## 2015-12-09 MED ORDER — SUCCINYLCHOLINE CHLORIDE 200 MG/10ML IV SOSY
PREFILLED_SYRINGE | INTRAVENOUS | Status: AC
  Filled 2015-12-09: qty 10

## 2015-12-09 MED ORDER — SENNA 8.6 MG PO TABS
1.0000 | ORAL_TABLET | Freq: Two times a day (BID) | ORAL | Status: DC
  Administered 2015-12-10 – 2015-12-14 (×9): 8.6 mg via ORAL
  Filled 2015-12-09 (×9): qty 1

## 2015-12-09 MED ORDER — FENTANYL CITRATE (PF) 250 MCG/5ML IJ SOLN
INTRAMUSCULAR | Status: DC | PRN
  Administered 2015-12-09 (×2): 50 ug via INTRAVENOUS

## 2015-12-09 MED ORDER — LACTATED RINGERS IV SOLN
INTRAVENOUS | Status: DC | PRN
  Administered 2015-12-09 (×2): via INTRAVENOUS

## 2015-12-09 MED ORDER — POVIDONE-IODINE 10 % EX SWAB: 2.0000 "application " | Freq: Once | CUTANEOUS | Status: DC

## 2015-12-09 MED ORDER — OXYCODONE HCL 5 MG/5ML PO SOLN: 5.0000 mg | Freq: Once | ORAL | Status: DC | PRN

## 2015-12-09 MED ORDER — SODIUM CHLORIDE 0.9 % IV SOLN
INTRAVENOUS | Status: DC
  Administered 2015-12-10 (×2): via INTRAVENOUS

## 2015-12-09 MED ORDER — HYDROCODONE-ACETAMINOPHEN 5-325 MG PO TABS
1.0000 | ORAL_TABLET | Freq: Four times a day (QID) | ORAL | Status: DC | PRN
  Administered 2015-12-10 – 2015-12-17 (×14): 2 via ORAL
  Filled 2015-12-09 (×15): qty 2

## 2015-12-09 MED ORDER — PREDNISOLONE ACETATE 1 % OP SUSP
1.0000 [drp] | Freq: Two times a day (BID) | OPHTHALMIC | Status: DC
  Filled 2015-12-09 (×2): qty 1

## 2015-12-09 MED ORDER — ALBUTEROL SULFATE (2.5 MG/3ML) 0.083% IN NEBU: 2.5000 mg | INHALATION_SOLUTION | RESPIRATORY_TRACT | Status: DC | PRN

## 2015-12-09 MED ORDER — LABETALOL HCL 100 MG PO TABS
100.0000 mg | ORAL_TABLET | Freq: Two times a day (BID) | ORAL | Status: DC
  Administered 2015-12-11 – 2015-12-17 (×11): 100 mg via ORAL
  Filled 2015-12-09 (×17): qty 1

## 2015-12-09 MED ORDER — INSULIN ASPART 100 UNIT/ML ~~LOC~~ SOLN
0.0000 [IU] | Freq: Four times a day (QID) | SUBCUTANEOUS | Status: DC
  Administered 2015-12-10 (×2): 1 [IU] via SUBCUTANEOUS

## 2015-12-09 MED ORDER — ONDANSETRON HCL 4 MG/2ML IJ SOLN
INTRAMUSCULAR | Status: DC | PRN
  Administered 2015-12-09: 4 mg via INTRAVENOUS

## 2015-12-09 MED ORDER — MORPHINE SULFATE (PF) 2 MG/ML IV SOLN
0.5000 mg | INTRAVENOUS | Status: DC | PRN
  Administered 2015-12-10 – 2015-12-15 (×4): 0.5 mg via INTRAVENOUS
  Filled 2015-12-09 (×4): qty 1

## 2015-12-09 MED ORDER — DOCUSATE SODIUM 100 MG PO CAPS
100.0000 mg | ORAL_CAPSULE | Freq: Two times a day (BID) | ORAL | Status: DC
  Administered 2015-12-10 – 2015-12-14 (×9): 100 mg via ORAL
  Filled 2015-12-09 (×9): qty 1

## 2015-12-09 MED ORDER — PHENOL 1.4 % MT LIQD: 1.0000 | OROMUCOSAL | Status: DC | PRN

## 2015-12-09 MED ORDER — PHENYLEPHRINE HCL 10 MG/ML IJ SOLN
INTRAVENOUS | Status: DC | PRN
  Administered 2015-12-09: 40 ug/min via INTRAVENOUS

## 2015-12-09 MED ORDER — ACETAMINOPHEN 325 MG PO TABS
650.0000 mg | ORAL_TABLET | Freq: Four times a day (QID) | ORAL | Status: DC | PRN
  Administered 2015-12-10 – 2015-12-11 (×4): 650 mg via ORAL
  Filled 2015-12-09 (×4): qty 2

## 2015-12-09 MED ORDER — OXYCODONE HCL 5 MG PO TABS: 5.0000 mg | ORAL_TABLET | Freq: Once | ORAL | Status: DC | PRN

## 2015-12-09 MED ORDER — VANCOMYCIN HCL IN DEXTROSE 1-5 GM/200ML-% IV SOLN
INTRAVENOUS | Status: AC
  Administered 2015-12-09: 1000 mg via INTRAVENOUS
  Filled 2015-12-09: qty 200

## 2015-12-09 MED ORDER — ALBUTEROL SULFATE (2.5 MG/3ML) 0.083% IN NEBU: 3.0000 mL | INHALATION_SOLUTION | RESPIRATORY_TRACT | Status: DC | PRN

## 2015-12-09 MED ORDER — ACETAMINOPHEN 325 MG PO TABS: 650.0000 mg | ORAL_TABLET | Freq: Four times a day (QID) | ORAL | Status: DC | PRN

## 2015-12-09 MED ORDER — CLOPIDOGREL BISULFATE 75 MG PO TABS
75.0000 mg | ORAL_TABLET | Freq: Every day | ORAL | Status: DC
  Administered 2015-12-10 – 2015-12-17 (×8): 75 mg via ORAL
  Filled 2015-12-09 (×8): qty 1

## 2015-12-09 MED ORDER — PROPOFOL 10 MG/ML IV BOLUS
INTRAVENOUS | Status: AC
  Filled 2015-12-09: qty 20

## 2015-12-09 MED ORDER — LIDOCAINE 2% (20 MG/ML) 5 ML SYRINGE
INTRAMUSCULAR | Status: AC
  Filled 2015-12-09: qty 10

## 2015-12-09 MED ORDER — MORPHINE SULFATE (PF) 2 MG/ML IV SOLN: 1.0000 mg | INTRAVENOUS | Status: DC | PRN

## 2015-12-09 MED ORDER — CHLORHEXIDINE GLUCONATE 4 % EX LIQD: 60.0000 mL | Freq: Once | CUTANEOUS | Status: DC

## 2015-12-09 MED ORDER — ACETAMINOPHEN 650 MG RE SUPP: 650.0000 mg | Freq: Four times a day (QID) | RECTAL | Status: DC | PRN

## 2015-12-09 MED ORDER — METHOCARBAMOL 1000 MG/10ML IJ SOLN
500.0000 mg | Freq: Once | INTRAVENOUS | Status: AC
  Administered 2015-12-09: 500 mg via INTRAVENOUS
  Filled 2015-12-09: qty 550

## 2015-12-09 MED ORDER — 0.9 % SODIUM CHLORIDE (POUR BTL) OPTIME
TOPICAL | Status: DC | PRN
  Administered 2015-12-09: 1000 mL

## 2015-12-09 MED ORDER — FERROUS SULFATE 325 (65 FE) MG PO TABS
325.0000 mg | ORAL_TABLET | Freq: Every day | ORAL | Status: DC
  Administered 2015-12-10 – 2015-12-14 (×5): 325 mg via ORAL
  Filled 2015-12-09 (×5): qty 1

## 2015-12-09 MED ORDER — EPHEDRINE 5 MG/ML INJ
INTRAVENOUS | Status: AC
  Filled 2015-12-09: qty 10

## 2015-12-09 SURGICAL SUPPLY — 63 items
ALCOHOL ISOPROPYL (RUBBING) (MISCELLANEOUS) ×3 IMPLANT
BANDAGE ELASTIC 6 VELCRO ST LF (GAUZE/BANDAGES/DRESSINGS) ×3 IMPLANT
BIT DRILL 4.3 (BIT) ×5 IMPLANT
BIT DRILL 4.3MM (BIT) ×2
BIT DRILL 4.3X300MM (BIT) ×2 IMPLANT
BLADE SAW RECIP 87.9 MT (BLADE) ×3 IMPLANT
BNDG ELASTIC 6X10 VLCR STRL LF (GAUZE/BANDAGES/DRESSINGS) ×3 IMPLANT
BNDG GAUZE ELAST 4 BULKY (GAUZE/BANDAGES/DRESSINGS) ×3 IMPLANT
CAP LOCK NCB (Cap) ×12 IMPLANT
CHLORAPREP W/TINT 26ML (MISCELLANEOUS) ×3 IMPLANT
CUFF TOURNIQUET SINGLE 34IN LL (TOURNIQUET CUFF) ×3 IMPLANT
DERMABOND ADVANCED (GAUZE/BANDAGES/DRESSINGS) ×2
DERMABOND ADVANCED .7 DNX12 (GAUZE/BANDAGES/DRESSINGS) ×1 IMPLANT
DRAIN HEMOVAC 7FR (DRAIN) IMPLANT
DRAPE SHEET LG 3/4 BI-LAMINATE (DRAPES) ×6 IMPLANT
DRAPE U-SHAPE 47X51 STRL (DRAPES) ×3 IMPLANT
DRAPE UNIVERSAL PACK (DRAPES) IMPLANT
DRILL BIT 4.3 (BIT) ×2
DRSG AQUACEL AG ADV 3.5X14 (GAUZE/BANDAGES/DRESSINGS) ×3 IMPLANT
DRSG PAD ABDOMINAL 8X10 ST (GAUZE/BANDAGES/DRESSINGS) ×3 IMPLANT
DRSG TEGADERM 2-3/8X2-3/4 SM (GAUZE/BANDAGES/DRESSINGS) IMPLANT
ELECT REM PT RETURN 9FT ADLT (ELECTROSURGICAL) ×3
ELECTRODE REM PT RTRN 9FT ADLT (ELECTROSURGICAL) ×1 IMPLANT
EVACUATOR 1/8 PVC DRAIN (DRAIN) IMPLANT
GLOVE BIO SURGEON STRL SZ8.5 (GLOVE) ×6 IMPLANT
GLOVE BIOGEL PI IND STRL 7.5 (GLOVE) ×1 IMPLANT
GLOVE BIOGEL PI IND STRL 8.5 (GLOVE) ×1 IMPLANT
GLOVE BIOGEL PI INDICATOR 7.5 (GLOVE) ×2
GLOVE BIOGEL PI INDICATOR 8.5 (GLOVE) ×2
GOWN STRL REUS W/TWL 2XL LVL3 (GOWN DISPOSABLE) ×3 IMPLANT
HANDPIECE INTERPULSE COAX TIP (DISPOSABLE) ×2
K-WIRE 2.0 (WIRE) ×2
K-WIRE FXSTD 280X2XNS SS (WIRE) ×1
KIT BASIN OR (CUSTOM PROCEDURE TRAY) ×3 IMPLANT
KWIRE FXSTD 280X2XNS SS (WIRE) ×1 IMPLANT
MANIFOLD NEPTUNE II (INSTRUMENTS) ×3 IMPLANT
NEEDLE SPNL 18GX3.5 QUINCKE PK (NEEDLE) ×6 IMPLANT
PACK TOTAL JOINT (CUSTOM PROCEDURE TRAY) ×3 IMPLANT
PACK TOTAL KNEE CUSTOM (KITS) ×3 IMPLANT
PADDING CAST COTTON 6X4 STRL (CAST SUPPLIES) ×3 IMPLANT
PLATE NCB 15H HIP (Plate) ×3 IMPLANT
SAW OSC TIP CART 19.5X105X1.3 (SAW) ×3 IMPLANT
SCREW 5.0 70MM (Screw) ×6 IMPLANT
SCREW 5.0 80MM (Screw) ×3 IMPLANT
SCREW NCB 3.5X75X5X6.2XST (Screw) ×1 IMPLANT
SCREW NCB 5.0X34MM (Screw) ×3 IMPLANT
SCREW NCB 5.0X36MM (Screw) ×9 IMPLANT
SCREW NCB 5.0X75MM (Screw) ×2 IMPLANT
SEALER BIPOLAR AQUA 6.0 (INSTRUMENTS) ×3 IMPLANT
SET HNDPC FAN SPRY TIP SCT (DISPOSABLE) ×1 IMPLANT
SET PAD KNEE POSITIONER (MISCELLANEOUS) ×3 IMPLANT
SPONGE GAUZE 4X4 12PLY STER LF (GAUZE/BANDAGES/DRESSINGS) ×3 IMPLANT
SUT MNCRL AB 3-0 PS2 18 (SUTURE) ×3 IMPLANT
SUT MNCRL AB 3-0 PS2 27 (SUTURE) IMPLANT
SUT MON AB 2-0 CT1 36 (SUTURE) ×6 IMPLANT
SUT VIC AB 1 CTX 27 (SUTURE) ×3 IMPLANT
SUT VIC AB 2-0 CT1 27 (SUTURE) ×4
SUT VIC AB 2-0 CT1 TAPERPNT 27 (SUTURE) ×2 IMPLANT
SUT VLOC 180 0 24IN GS25 (SUTURE) IMPLANT
SYR 50ML LL SCALE MARK (SYRINGE) ×6 IMPLANT
TOWER CARTRIDGE SMART MIX (DISPOSABLE) IMPLANT
TRAY FOLEY CATH SILVER 16FR (SET/KITS/TRAYS/PACK) ×3 IMPLANT
WRAP KNEE MAXI GEL POST OP (GAUZE/BANDAGES/DRESSINGS) IMPLANT

## 2015-12-09 NOTE — Interval H&P Note (Signed)
History and Physical Interval Note:  12/09/2015 5:48 PM   I was asked to assume care of the patient by Dr. Alma Friendly. The patient lives independently and she fell this morning, sustaining a periprosthetic right distal femur fracture. She has a history of a right total knee replacement done in [place] approximately 11-12 years ago. CT scan imaging shows that the fracture proceeds a little distal to the anterior flange. The knee replacement appears intact without evidence of loosening. She does have significant peripheral arterial disease with previous stenting. She does not have palpable pulses in either lower extremity, however she does have a good dopplerable signal. I had a lengthy discussion with the patient and her family regarding ORIF of the fracture with a locking plate construct versus revision to a distal femur replacement. They elected to proceed with ORIF, which I think is completely reasonable. We discussed the risks, benefits, and alternatives.  [name]  has presented today for surgery, with the diagnosis of Right Periprosthestic Distal Femur Fx  The various methods of treatment have been discussed with the patient and family. After consideration of risks, benefits and other options for treatment, the patient has consented to  Procedure(s): ORIF OR REVISION TOTAL KNEE ARTHROPLASTY (Right) as a surgical intervention .  The patient's history has been reviewed, patient examined, no change in status, stable for surgery.  I have reviewed the patient's chart and labs.  Questions were answered to the patient's satisfaction.    The risks, benefits, and alternatives were discussed with the patient. There are risks associated with the surgery including, but not limited to, problems with anesthesia (death), infection, differences in leg length/angulation/rotation, fracture of bones, loosening or failure of implants, malunion, nonunion, hematoma (blood accumulation) which may require surgical drainage,  blood clots, pulmonary embolism, nerve injury (foot drop), and blood vessel injury. The patient understands these risks and elects to proceed.    Hurman Ketelsen, Horald Pollen

## 2015-12-09 NOTE — Discharge Instructions (Signed)
Non weight bearing right leg You may bend your knee to prevent stiffness Keep dressings clean and dry Begin straight leg raises for quad strengthening

## 2015-12-09 NOTE — Progress Notes (Signed)
This is a no charge note.  Pending admission per Dr. Roxanne Mins.  Hx of hypertension, diabetes mellitus, COPD, asthma, coming from [facility] for visiting, who presents with right distal femur fracture after fall. Lonn Georgia was consulted.   Ivor Costa, MD  [facility] Pager [phone]  If 7PM-7AM, please contact night-coverage [email or web address] Password [email or web address] 12/09/2015, 6:04 AM

## 2015-12-09 NOTE — Anesthesia Postprocedure Evaluation (Signed)
Anesthesia Post Note  Patient: [name]  Procedure(s) Performed: Procedure(s) (LRB): ORIF OF DISTAL FEMUR (Right)  Patient location during evaluation: PACU Anesthesia Type: General Level of consciousness: awake and alert Pain management: pain level controlled Vital Signs Assessment: post-procedure vital signs reviewed and stable Respiratory status: spontaneous breathing, nonlabored ventilation, respiratory function stable and patient connected to nasal cannula oxygen Cardiovascular status: blood pressure returned to baseline and stable Postop Assessment: no signs of nausea or vomiting Anesthetic complications: no    Last Vitals:  Vitals:   12/09/15 1226 12/09/15 1550  BP: 122/56 (!) 133/53  Pulse: (!) 55 (!) 59  Resp: 14 11  Temp:      Last Pain:  Vitals:   12/09/15 2150  TempSrc:   PainSc: [name]

## 2015-12-09 NOTE — ED Provider Notes (Signed)
[facility] DEPT Provider Note   CSN: [id number] Arrival date & time: 12/08/15  2337  By signing my name below, I, Irene Pap, attest that this documentation has been prepared under the direction and in the presence of Delora Fuel, MD. Electronically Signed: Irene Pap, ED Scribe. 12/09/15. 12:25 AM.  First Provider Contact:  First MD Initiated Contact with Patient 12/09/15 0010     History   Chief Complaint Chief Complaint  Patient presents with  . Fall   The history is provided by the patient. No language interpreter was used.  HPI Comments: [name] is a 82 y.o. female who presents to the Emergency Department complaining of a fall onset PTA. She currently rates her pain 2/10 but states that it is 10/10 at its worst. Pt reports that she slipped and fell, landing on her knees. She reports associated swelling to the right knee. She reports hx of knee replacement. She currently has a cam boot on the right foot from a former break. She has not been able to ambulate since the fall. She denies hitting head, LOC, numbness, or weakness.   No past medical history on file.  There are no active problems to display for this patient.   No past surgical history on file.  OB History    No data available       Home Medications    Prior to Admission medications   Not on File    Family History No family history on file.  Social History Social History  Substance Use Topics  . Smoking status: Not on file  . Smokeless tobacco: Not on file  . Alcohol use Not on file     Allergies   Review of patient's allergies indicates not on file.   Review of Systems Review of Systems  Musculoskeletal: Positive for arthralgias and joint swelling.  Neurological: Negative for syncope, weakness and numbness.  All other systems reviewed and are negative.  Physical Exam Updated Vital Signs BP 152/74 (BP Location: Right Arm)   Pulse 61   Temp 97.5 F (36.4 C) (Oral)   Resp  18   SpO2 96%   Physical Exam  Constitutional: She is oriented to person, place, and time. She appears well-developed and well-nourished.  HENT:  Head: Normocephalic and atraumatic.  Eyes: EOM are normal. Pupils are equal, round, and reactive to light.  Neck: Normal range of motion. Neck supple. No JVD present.  Cardiovascular: Normal rate, regular rhythm and normal heart sounds.   No murmur heard. Pulmonary/Chest: Effort normal and breath sounds normal. She has no wheezes. She has no rales. She exhibits no tenderness.  Abdominal: Soft. She exhibits no distension and no mass. There is no tenderness.  Musculoskeletal: Normal range of motion. She exhibits no edema.       Right knee: She exhibits effusion. Tenderness found.       Left knee: She exhibits ecchymosis. She exhibits normal range of motion. Tenderness found.  Right knee: Moderate to large effusion; marked tenderness of the distal femur; no tenderness over the proximal tibia or fibula; marked pain on any movement Left knee: area of ecchymosis and tenderness; normal ROM; no significant effusion  Lymphadenopathy:    She has no cervical adenopathy.  Neurological: She is alert and oriented to person, place, and time. No cranial nerve deficit. She exhibits normal muscle tone. Coordination normal.  Skin: Skin is warm and dry. No rash noted.  Psychiatric: She has a normal mood and affect. Her behavior is normal.  Judgment and thought content normal.  Nursing note and vitals reviewed.    ED Treatments / Results  DIAGNOSTIC STUDIES: Oxygen Saturation is 96% on RA, normal by my interpretation.    COORDINATION OF CARE: 12:17 AM-Discussed treatment plan which includes x-ray with pt at bedside and pt agreed to plan.    Labs (all labs ordered are listed, but only abnormal results are displayed) Labs Reviewed  BASIC METABOLIC PANEL - Abnormal; Notable for the following:       Result Value   Glucose, Bld 182 (*)    BUN 40 (*)     Creatinine, Ser 2.04 (*)    Calcium 8.8 (*)    GFR calc non Af Amer 21 (*)    GFR calc Af Amer 24 (*)    All other components within normal limits  CBC WITH DIFFERENTIAL/PLATELET - Abnormal; Notable for the following:    Hemoglobin 11.8 (*)    All other components within normal limits  URINALYSIS, ROUTINE W REFLEX MICROSCOPIC (NOT AT [facility]) - Abnormal; Notable for the following:    Leukocytes, UA SMALL (*)    All other components within normal limits  URINE MICROSCOPIC-ADD ON - Abnormal; Notable for the following:    Squamous Epithelial / LPF 0-5 (*)    Bacteria, UA FEW (*)    All other components within normal limits  PROTIME-INR  APTT  HEMOGLOBIN A1C  TYPE AND SCREEN  ABO/RH    EKG  EKG Interpretation  Date/Time:  Tuesday December 09 2015 04:27:36 EDT Ventricular Rate:  58 PR Interval:    QRS Duration: 117 QT Interval:  472 QTC Calculation: 464 R Axis:   -42 Text Interpretation:  Sinus rhythm Multiple premature complexes, vent & supraven Incomplete left bundle branch block LVH with secondary repolarization abnormality When compared with ECG of 04/29/2008, Incomplete left bundle branch block is now Present Premature ventricular complexes are now Present Confirmed by [facility]  MD, Mikaia Janvier ([id number]) on 12/09/2015 4:39:30 AM       Radiology Dg Chest 1 View  Result Date: 12/09/2015 CLINICAL DATA:  [place] tonight. History of COPD, hypertension, diabetes, LEFT breast cancer. EXAM: CHEST 1 VIEW COMPARISON:  Chest radiograph April 29, 2008 FINDINGS: Cardiomediastinal silhouette is normal for this apical lordotic technique. Calcified aortic knob. Large hiatal hernia. Hepatic eventration. No pneumothorax. Osteopenia. IMPRESSION: No acute cardiopulmonary process considering apical lordotic technique. Large hiatal hernia. Electronically Signed   By: Elon Alas M.D.   On: 12/09/2015 01:56   Dg Knee Complete 4 Views Left  Result Date: 12/09/2015 CLINICAL DATA:  Fall at home in her den onto  carpeted floor. Bilateral knee pain. EXAM: LEFT KNEE - COMPLETE 4+ VIEW COMPARISON:  None. FINDINGS: No fracture or dislocation. The alignment is maintained. Mild medial tibial femoral joint space narrowing. No joint effusion. Small quadriceps enthesophyte. There is mild prepatellar soft tissue edema. Vascular calcifications are seen. IMPRESSION: No acute fracture or subluxation. Mild medial tibial femoral joint space narrowing. Electronically Signed   By: Jeb Levering M.D.   On: 12/09/2015 01:54   Dg Knee Complete 4 Views Right  Result Date: 12/09/2015 CLINICAL DATA:  Fall at home in her den onto carpeted floor. Bilateral knee pain. EXAM: RIGHT KNEE - COMPLETE 4+ VIEW COMPARISON:  None. FINDINGS: Displaced angulated distal femur fracture just proximal to right knee arthroplasty. Approximately 2 cm of impaction. There is apex lateral angulation and subluxation of the distal fracture fragment. Fracture extends to the femoral component of the arthroplasty anteriorly. There is  been patellar resurfacing. Tibial component of the arthroplasty is intact. There is a lipohemarthrosis. Vascular calcifications are seen. IMPRESSION: Displaced angulated periprosthetic fracture of the distal femur extending to the femoral component of the right knee arthroplasty. Lipohemarthrosis, small to moderate in size. Electronically Signed   By: Jeb Levering M.D.   On: 12/09/2015 01:57    Procedures Procedures (including critical care time)  Medications Ordered in ED Medications  albuterol (PROVENTIL) (2.5 MG/3ML) 0.083% nebulizer solution 2.5 mg (not administered)  ondansetron (ZOFRAN) injection 4 mg (not administered)  acetaminophen (TYLENOL) tablet 650 mg (not administered)  morphine 2 MG/ML injection 2 mg (not administered)  metoprolol (LOPRESSOR) injection 5 mg (not administered)  hydrALAZINE (APRESOLINE) injection 10 mg (not administered)  dextrose 5 %-0.45 % sodium chloride infusion (not administered)    insulin aspart (novoLOG) injection 0-9 Units (not administered)  morphine 4 MG/ML injection 4 mg (4 mg Intravenous Given 12/09/15 0038)  ondansetron (ZOFRAN) injection 4 mg (4 mg Intravenous Given 12/09/15 0153)  morphine 4 MG/ML injection 4 mg (4 mg Intravenous Given 12/09/15 0616)  methocarbamol (ROBAXIN) 500 mg in dextrose 5 % 50 mL IVPB (0 mg Intravenous Stopped 12/09/15 0618)  ondansetron (ZOFRAN) injection 4 mg (4 mg Intravenous Given 12/09/15 0617)   Initial Impression / Assessment and Plan / ED Course  I have reviewed the triage vital signs and the nursing notes.  Pertinent labs & imaging results that were available during my care of the patient were reviewed by me and considered in my medical decision making (see chart for details).  Clinical Course    Fall at home with injury to right leg. I'm concerned about possibility of fracture sent to the total knee prosthesis. She is sent for x-rays which confirm fracture of the distal right femur just proximal to the knee prosthesis. Case is discussed with Dr. Veverly Fells of orthopedics who has come in to evaluate the patient. She was placed in a knee immobilizer for comfort. She has multiple medical problems and he has requested that she be admitted to the medicine service. Case is discussed with Dr. Blaine Hamper [facility] who agrees to accept the patient. Unfortunately, because of lack of operating room availability here, she will need to be transferred to [facility]. Laboratory workup on shows renal insufficiency which is unchanged from baseline.  Final Clinical Impressions(s) / ED Diagnoses   Final diagnoses:  Fall at home, initial encounter  Closed fracture of right distal femur, initial encounter  Renal insufficiency  I personally performed the services described in this documentation, which was scribed in my presence. The recorded information has been reviewed and is accurate.      New Prescriptions New Prescriptions   No  medications on file     Delora Fuel, MD [id number]

## 2015-12-09 NOTE — Brief Op Note (Signed)
12/09/2015  7:52 PM  PATIENT:  [name]  82 y.o. female  PRE-OPERATIVE DIAGNOSIS:  Right Periprosthestic Distal Femur Fx  POST-OPERATIVE DIAGNOSIS:  Right Periprosthetic Distal Femur Fx  PROCEDURE:  Procedure(s): ORIF OF DISTAL FEMUR (Right)  SURGEON:  Surgeon(s) and Role:    * Rod Can, MD - Primary  PHYSICIAN ASSISTANT: None  ASSISTANTS: Laure Kidney, RNFA   ANESTHESIA:   general  EBL:  Total I/O In: 1250 [I.V.:1250] Out: 325 [Urine:250; Blood:75]  BLOOD ADMINISTERED:none  DRAINS: none   LOCAL MEDICATIONS USED:  NONE  SPECIMEN:  No Specimen  DISPOSITION OF SPECIMEN:  N/A  COUNTS:  YES  TOURNIQUET:  * No tourniquets in log *  DICTATION: .Other Dictation: Dictation Number [id number]  PLAN OF CARE: Admit to inpatient   PATIENT DISPOSITION:  PACU - hemodynamically stable.   Delay start of Pharmacological VTE agent (>24hrs) due to surgical blood loss or risk of bleeding: no

## 2015-12-09 NOTE — Anesthesia Procedure Notes (Signed)
Procedure Name: Intubation Date/Time: 12/09/2015 6:09 PM Performed by: Mervyn Gay Pre-anesthesia Checklist: Patient identified, Patient being monitored, Timeout performed, Emergency Drugs available and Suction available Patient Re-evaluated:Patient Re-evaluated prior to inductionOxygen Delivery Method: Circle System Utilized Preoxygenation: Pre-oxygenation with 100% oxygen Intubation Type: IV induction Ventilation: Mask ventilation without difficulty and Oral airway inserted - appropriate to patient size Laryngoscope Size: [place] and 3 Grade View: Grade I Tube type: Oral Tube size: 7.5 mm Number of attempts: 1 Airway Equipment and Method: Stylet Placement Confirmation: ETT inserted through vocal cords under direct vision,  positive ETCO2 and breath sounds checked- equal and bilateral Secured at: 21 cm Tube secured with: Tape Dental Injury: Teeth and Oropharynx as per pre-operative assessment

## 2015-12-09 NOTE — H&P (Signed)
TRH H&P   Patient Demographics:    Yuette Teall, is a 82 y.o. female  MRN: [id number]   DOB - Nov 20, 1925  Admit Date - 12/08/2015  Outpatient Primary MD for the patient is No PCP Per Patient    Patient coming from: daughters home here, she is from [facility]  Chief Complaint  Patient presents with  . Fall      HPI:    Vashanti Randlett  is a 82 y.o. female, CAD status post stent placement in [place] in 2005, PAD, CK D stage III last creatinine in our system close to 1.3 - 7 yrs ago, COPD on nighttime oxygen, type 2 diabetes mellitus on insulin, history of breast nodule on the left side which was removed a few years ago, essential hypertension, dyslipidemia who essential he lives in [place] and was visiting her daughter locally. According to the patient last night while getting out of the bed she tripped and fell, she experienced right-sided hip pain and came to the ER.  In the ER she was found to have right femoral neck fracture, orthopedics was consulted who requested the patient be transferred to [facility] and we were requested to admit the patient. Patient at this time is having right-sided dull constant hip pain which is nonradiating, pain ongoing since last night, worse with activity better with rest. No other associated symptoms. Patient has no chest pain, no cough phlegm or shortness of breath, she can climb one flight of stairs without any issues on a good day. She is fairly active for her age. All other review of systems are negative.    Review of systems:    In addition to the HPI above,   No Fever-chills, No Headache, No changes with Vision or hearing, No problems swallowing food or Liquids, No Chest pain,  Cough or Shortness of Breath, No Abdominal pain, No Nausea or Vommitting, Bowel movements are regular, No Blood in stool or Urine, No dysuria, No new skin rashes or bruises, No new joints pains-aches, R hip pain +ve No new weakness, tingling, numbness in any extremity, No recent weight gain or loss, No polyuria, polydypsia or polyphagia, No significant Mental Stressors.  A full 10 point Review of Systems was done, except as stated above, all other Review of Systems were negative.   With Past History of the  following :    Past Medical History:  Diagnosis Date  . Arthritis   . Asthma   . Cancer [facility])    breast - left  . COPD (chronic obstructive pulmonary disease) ([place])   . Diabetes mellitus without complication ([place])    Type II  . Hypertension       Past Surgical History:  Procedure Laterality Date  . ABDOMINAL HYSTERECTOMY  1975  . APPENDECTOMY  1947  . BREAST LUMPECTOMY Left 2015  . FEMORAL ARTERY STENT     x2  . REPLACEMENT TOTAL KNEE Right   . STENT PLACEMENT VASCULAR ([place] [facility])     2 in chest  . TONSILLECTOMY  1934      Social History:     Social History  Substance Use Topics  . Smoking status: Former Smoker    Types: Cigarettes    Quit date: 05/10/1998  . Smokeless tobacco: Never Used  . Alcohol use No       Family History :   Family history positive for diabetes mellitus type 2   Home Medications:   Prior to Admission medications   Medication Sig Start Date End Date Taking? Authorizing Provider  anastrozole (ARIMIDEX) 1 MG tablet Take 1 mg by mouth daily.    Yes Historical Provider, MD  b complex vitamins tablet Take 1 tablet by mouth daily.   Yes Historical Provider, MD  budesonide (PULMICORT) 0.25 MG/2ML nebulizer solution Take 0.25 mg by nebulization daily.   Yes Historical Provider, MD  clopidogrel (PLAVIX) 75 MG tablet Take 75 mg by mouth daily.   Yes Historical Provider, MD  Ferrous Sulfate (IRON) 325 (65 Fe) MG TABS Take 325 mg by mouth  daily.   Yes Historical Provider, MD  formoterol (PERFOROMIST) 20 MCG/2ML nebulizer solution Take 20 mcg by nebulization daily.    Yes Historical Provider, MD  furosemide (LASIX) 40 MG tablet Take 40 mg by mouth daily.    Yes Historical Provider, MD  Garlic [id number] MG CAPS Take 1,000 mg by mouth daily.   Yes Historical Provider, MD  Insulin Glargine (TOUJEO SOLOSTAR) 300 UNIT/ML SOPN Inject 10 Units into the skin daily.   Yes Historical Provider, MD  labetalol (NORMODYNE) 100 MG tablet Take 100 mg by mouth 2 (two) times daily.   Yes Historical Provider, MD  lovastatin (MEVACOR) 20 MG tablet Take 20 mg by mouth daily.    Yes Historical Provider, MD  meloxicam (MOBIC) 7.5 MG tablet Take 7.5 mg by mouth daily as needed for pain.  10/14/15  Yes Historical Provider, MD  Multiple Vitamins-Minerals (OCUTABS) TABS Take 1 tablet by mouth daily.   Yes Historical Provider, MD  NIFEdipine (PROCARDIA-XL/ADALAT CC) 30 MG 24 hr tablet Take 30 mg by mouth 2 (two) times daily. 09/05/15  Yes Historical Provider, MD  nitroGLYCERIN (NITROSTAT) 0.4 MG SL tablet Place 0.4 mg under the tongue every 5 (five) minutes as needed for chest pain.   Yes Historical Provider, MD  Omega-3 Fatty Acids (OMEGA-3 PLUS PO) Take 1 capsule by mouth 2 (two) times daily.    Yes Historical Provider, MD  Polyvinyl Alcohol-Povidone (REFRESH OP) Place 1 drop into both eyes 2 (two) times daily.   Yes Historical Provider, MD  potassium chloride SA (K-DUR,KLOR-CON) 20 MEQ tablet Take 20 mEq by mouth daily.   Yes Historical Provider, MD  prednisoLONE acetate (PRED FORTE) 1 % ophthalmic suspension 3 drops 2 (two) times daily. 11/26/15  Yes Historical Provider, MD  saxagliptin HCl (ONGLYZA) 2.5 MG TABS  tablet Take 2.5 mg by mouth daily.   Yes Historical Provider, MD  traMADol (ULTRAM) 50 MG tablet Take 50 mg by mouth every 6 (six) hours as needed for moderate pain.   Yes Historical Provider, MD  VENTOLIN HFA 108 (90 Base) MCG/ACT inhaler Inhale 2 puffs into  the lungs every 4 (four) hours as needed for wheezing or shortness of breath.  09/18/15  Yes Historical Provider, MD     Allergies:     Allergies  Allergen Reactions  . Sulfa Antibiotics Other (See Comments)    Reaction unknown  . Tape Other (See Comments)    Blisters  . Penicillins Rash    Has patient had a PCN reaction causing immediate rash, facial/tongue/throat swelling, SOB or lightheadedness with hypotension: yes Has patient had a PCN reaction causing severe rash involving mucus membranes or skin necrosis: no Has patient had a PCN reaction that required hospitalization yes as a teenager Has patient had a PCN reaction occurring within the last 10 years: no was a teenager If all of the above answers are "NO", then may proceed with Cephalosporin use.      Physical Exam:   Vitals  Blood pressure (!) 107/54, pulse (!) 55, temperature 97.5 F (36.4 C), temperature source Oral, resp. rate 10, SpO2 100 %.   1. General elderly white female in hospital bed in no apparent distress  2. Normal affect and insight, Not Suicidal or Homicidal, Awake Alert, Oriented X 3.  3. No F.N deficits, ALL C.Nerves Intact, Strength 5/5 all 4 extremities, Sensation intact all 4 extremities, Plantars down going.  4. Ears and Eyes appear Normal, Conjunctivae clear, PERRLA. Moist Oral Mucosa.  5. Supple Neck, No JVD, No cervical lymphadenopathy appriciated, No Carotid Bruits.  6. Symmetrical Chest wall movement, Good air movement bilaterally, CTAB.  7. RRR, No Gallops, Rubs or Murmurs, No Parasternal Heave.  8. Positive Bowel Sounds, Abdomen Soft, No tenderness, No organomegaly appriciated,No rebound -guarding or rigidity.  9.  No Cyanosis, Normal Skin Turgor, No Skin Rash or Bruise.  10. Good muscle tone,  joints appear normal , no effusions, Normal ROM. Right leg externally rotated  11. No Palpable Lymph Nodes in Neck or Axillae      Data Review:    CBC  Recent Labs Lab 12/09/15 0402   WBC 8.9  HGB 11.8*  HCT 36.3  PLT 150  MCV 88.3  MCH 28.7  MCHC 32.5  RDW 14.4  LYMPHSABS 1.0  MONOABS 0.5  EOSABS 0.1  BASOSABS 0.0   ------------------------------------------------------------------------------------------------------------------  Chemistries   Recent Labs Lab 12/09/15 0402  NA 137  K 4.7  CL 103  CO2 25  GLUCOSE 182*  BUN 40*  CREATININE 2.04*  CALCIUM 8.8*   ------------------------------------------------------------------------------------------------------------------ CrCl cannot be calculated (Unknown ideal weight.). ------------------------------------------------------------------------------------------------------------------ No results for input(s): TSH, T4TOTAL, T3FREE, THYROIDAB in the last 72 hours.  Invalid input(s): FREET3  Coagulation profile  Recent Labs Lab 12/09/15 0402  INR 0.97   ------------------------------------------------------------------------------------------------------------------- No results for input(s): DDIMER in the last 72 hours. -------------------------------------------------------------------------------------------------------------------  Cardiac Enzymes No results for input(s): CKMB, TROPONINI, MYOGLOBIN in the last 168 hours.  Invalid input(s): CK ------------------------------------------------------------------------------------------------------------------ No results found for: BNP   ---------------------------------------------------------------------------------------------------------------  Urinalysis    Component Value Date/Time   COLORURINE YELLOW 12/09/2015 [place] 12/09/2015 0438   LABSPEC 1.014 12/09/2015 [place] 5.0 12/09/2015 [place] 12/09/2015 [place] 12/09/2015 [place] 12/09/2015 [id number]  KETONESUR NEGATIVE 12/09/2015 0438   PROTEINUR NEGATIVE 12/09/2015 0438   NITRITE NEGATIVE 12/09/2015  0438   LEUKOCYTESUR SMALL (A) 12/09/2015 0438    ----------------------------------------------------------------------------------------------------------------   Imaging Results:    Dg Chest 1 View  Result Date: 12/09/2015 CLINICAL DATA:  [place] tonight. History of COPD, hypertension, diabetes, LEFT breast cancer. EXAM: CHEST 1 VIEW COMPARISON:  Chest radiograph April 29, 2008 FINDINGS: Cardiomediastinal silhouette is normal for this apical lordotic technique. Calcified aortic knob. Large hiatal hernia. Hepatic eventration. No pneumothorax. Osteopenia. IMPRESSION: No acute cardiopulmonary process considering apical lordotic technique. Large hiatal hernia. Electronically Signed   By: Elon Alas M.D.   On: 12/09/2015 01:56   Ct Femur Right Wo Contrast  Result Date: 12/09/2015 CLINICAL DATA:  Acute distal right femur fracture. EXAM: CT OF THE RIGHT FEMUR WITHOUT CONTRAST TECHNIQUE: Multidetector CT imaging was performed according to the standard protocol. Multiplanar CT image reconstructions were also generated. COMPARISON:  Radiographs dated 12/09/2015 FINDINGS: There is a comminuted fracture of the distal femoral metaphysis just above the femoral component of the total knee prosthesis. There is a 15 mm of posterior displacement of the distal fragment. The fracture does not extend into the femoral condyles. The patella is intact. Proximal tibia and fibula are intact. No loosening of the prosthesis components. Small amount of hemorrhage into the adjacent soft tissues at the fracture site. The proximal femur appears normal. Minimal marginal osteophytes on the right acetabulum. IMPRESSION: Comminuted distal right femoral metaphysis with 15 mm of posterior displacement of the distal fragment. No extension into the femoral condyles. Electronically Signed   By: Lorriane Shire M.D.   On: 12/09/2015 08:27   Dg Knee Complete 4 Views Left  Result Date: 12/09/2015 CLINICAL DATA:  Fall at home in her den  onto carpeted floor. Bilateral knee pain. EXAM: LEFT KNEE - COMPLETE 4+ VIEW COMPARISON:  None. FINDINGS: No fracture or dislocation. The alignment is maintained. Mild medial tibial femoral joint space narrowing. No joint effusion. Small quadriceps enthesophyte. There is mild prepatellar soft tissue edema. Vascular calcifications are seen. IMPRESSION: No acute fracture or subluxation. Mild medial tibial femoral joint space narrowing. Electronically Signed   By: Jeb Levering M.D.   On: 12/09/2015 01:54   Dg Knee Complete 4 Views Right  Result Date: 12/09/2015 CLINICAL DATA:  Fall at home in her den onto carpeted floor. Bilateral knee pain. EXAM: RIGHT KNEE - COMPLETE 4+ VIEW COMPARISON:  None. FINDINGS: Displaced angulated distal femur fracture just proximal to right knee arthroplasty. Approximately 2 cm of impaction. There is apex lateral angulation and subluxation of the distal fracture fragment. Fracture extends to the femoral component of the arthroplasty anteriorly. There is been patellar resurfacing. Tibial component of the arthroplasty is intact. There is a lipohemarthrosis. Vascular calcifications are seen. IMPRESSION: Displaced angulated periprosthetic fracture of the distal femur extending to the femoral component of the right knee arthroplasty. Lipohemarthrosis, small to moderate in size. Electronically Signed   By: Jeb Levering M.D.   On: 12/09/2015 01:57    My personal review of EKG: Rhythm NSR, few PVCs, no Acute ST changes   Assessment & Plan:     1. Mechanical fall with closed right femoral neck fracture. Will be admitted to a telemetry bed, orthopedics consulted patient to be transferred to [place] stratification for surgery and recommendations to minimize the same:-  A.Cardio-Pulmonary Risk -  this patient is a moderate risk  for adverse Cardio-Pulmonary  Outcome  from surgery, the  risks and benefits were discussed and acceptable to the  patient  Recommendations for optimizing Cardio-Pulmonary  Risk risk factors  1. Keep SBP<140, HR<85, use Lopressor 5mg  IV q4hrs PRN, or B.Blocker drip PRN. 2. Moniotr I&Os. 3. Minimal sedation and Narcotics. 4. Good pulmunary toilet. 5. PRN Nebs and as needed oxygen to keep Pox>90% 6. Hb>8, transfuse as needed- Lasix 10mg  IV after each unit PRBC Transfused.   B.Bleeding Risk - no previous surgical complications, no easy bruising,  Antiplate meds plavix.   Lab Results  Component Value Date   PLT 150 12/09/2015                  Lab Results  Component Value Date   INR 0.97 12/09/2015   INR 1.2 05/01/2008   INR 1.2 04/30/2008      Will request Surgeon to please Order DVT prophylaxis of his/her choice, along with activity, weight bearing precautions and diet if appropriate.     2.CAD with stent placement in 2009. Chest pain-free, EKG nonacute, fairly active climbs one to 2 flights of stairs without any chest pain or shortness of breath, currently compensated. Plavix held. Continue home dose beta blocker. As needed IV Lopressor and hydralazine for blood pressure control.  3. DM type II. Hold Lantus, check A1c, every 6 sliding scale for now.  4. Essential hypertension. Home medications continued, IV Lopressor and hydralazine as needed.  5. CK D stage III. Last creatinine about 7 years ago is 1.3 in our system, currently creatinine is close to 2, this could be her new baseline. Will gently hydrate and monitor for now. Thereafter outpatient follow-up with PCP in [place].  6. Dyslipidemia. Continue home dose statin.    DVT Prophylaxis  SCDs   AM Labs Ordered, also please review Full Orders  Family Communication: Admission, patients condition and plan of care including tests being ordered have been discussed with the patient who indicates understanding and agree with the plan and Code Status.  Code Status full  Likely DC to  SNF in 2-3 days  Condition fair  Consults  called: Orthopedics Dr. Veverly Fells  Admission status: Inpt    Time spent in minutes : 35   SINGH,PRASHANT K M.D on 12/09/2015 at 8:53 AM  Between 7am to 7pm - Pager - [phone]. After 7pm go to [email or web address] - password [facility]  [facility] - Office  [phone]

## 2015-12-09 NOTE — H&P (Signed)
[name] is an 82 y.o. female.   Chief Complaint: R knee pain and deformity after fall HPI: 82 yo female who is visiting from [place] and fell late last night at home.  Patient was wearing a CAM walker for a right foot fracture and that may have contributed to the fall.  Patient complained of immediate right knee pain.  Denies LOC.  No other pain complaints.  Patient unable to stand or bear weight on the right LE after fall.  She reports being very active at home.  Past Medical History:  Diagnosis Date  . Arthritis   . Asthma   . Cancer [facility])    breast - left  . COPD (chronic obstructive pulmonary disease) ([place])   . Diabetes mellitus without complication ([place])    Type II  . 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 ABDOMINAL HYSTERECTOMY  1975  . APPENDECTOMY  1947  . BREAST LUMPECTOMY Left 2015  . FEMORAL ARTERY STENT     x2  . REPLACEMENT TOTAL KNEE Right   . STENT PLACEMENT VASCULAR ([place] HX)     2 in chest  . TONSILLECTOMY  1934    No family history on file. Social History:  reports that she quit smoking about 17 years ago. Her smoking use included Cigarettes. She has never used smokeless tobacco. She reports that she does not drink alcohol or use drugs.  Allergies:  Allergies  Allergen Reactions  . Penicillins   . Sulfa Antibiotics    MEDICINES: Plavix 75 mg po qd Lasix 40 mg po qd Klor Con M20 tab APA po qd Labetalol 100 mg BID Lovastatin 20 mg po qd Anastrozole 1 mg po qd Budesonide 0.25 mg  1 vial daily Perforomist 20 mcg 1 vial daily Nifedipine 30 mg po qd Toujelo solostar, insulin 10 units sq daily Onglyza 2.5 mg po qd Tramadol 50 md po as needed for back pain Meloxicam 7.5 mg po as needed Nitroglycerine 0.4 mg as neede B-100 complex po qd Ocutabs 2 po qd Garlic po qd Iron 65 mg po qd Omega plus 3 po qd  (Not in a hospital  admission)  Results for orders placed or performed during the hospital encounter of 12/08/15 (from the past 48 hour(s))  Basic metabolic panel     Status: Abnormal   Collection Time: 12/09/15  4:02 AM  Result Value Ref Range   Sodium 137 135 - 145 mmol/L   Potassium 4.7 3.5 - 5.1 mmol/L   Chloride 103 101 - 111 mmol/L   CO2 25 22 - 32 mmol/L   Glucose, Bld 182 (H) 65 - 99 mg/dL   BUN 40 (H) 6 - 20 mg/dL   Creatinine, Ser 2.04 (H) 0.44 - 1.00 mg/dL   Calcium 8.8 (L) 8.9 - 10.3 mg/dL   GFR calc non Af Amer 21 (L) >60 mL/min   GFR calc Af Amer 24 (L) >60 mL/min    Comment: (NOTE) The eGFR has been calculated using the CKD EPI equation. This calculation has not been validated in all clinical situations. eGFR's persistently <60 mL/min signify possible Chronic Kidney Disease.    Anion gap 9 5 - 15  CBC with Differential     Status: Abnormal   Collection Time: 12/09/15  4:02 AM  Result Value Ref Range   WBC 8.9 4.0 - 10.5 K/uL   RBC 4.11 3.87 -  5.11 MIL/uL   Hemoglobin 11.8 (L) 12.0 - 15.0 g/dL   HCT 36.3 36.0 - 46.0 %   MCV 88.3 78.0 - 100.0 fL   MCH 28.7 26.0 - 34.0 pg   MCHC 32.5 30.0 - 36.0 g/dL   RDW 14.4 11.5 - 15.5 %   Platelets 150 150 - 400 K/uL   Neutrophils Relative % 82 %   Neutro Abs 7.3 1.7 - 7.7 K/uL   Lymphocytes Relative 11 %   Lymphs Abs 1.0 0.7 - 4.0 K/uL   Monocytes Relative 6 %   Monocytes Absolute 0.5 0.1 - 1.0 K/uL   Eosinophils Relative 1 %   Eosinophils Absolute 0.1 0.0 - 0.7 K/uL   Basophils Relative 0 %   Basophils Absolute 0.0 0.0 - 0.1 K/uL  Type and screen [place]     Status: None   Collection Time: 12/09/15  4:02 AM  Result Value Ref Range   ABO/RH(D) O POS    Antibody Screen NEG    Sample Expiration 12/12/2015   Protime-INR     Status: None   Collection Time: 12/09/15  4:02 AM  Result Value Ref Range   Prothrombin Time 12.9 11.4 - 15.2 seconds   INR 0.97   APTT     Status: None   Collection Time: 12/09/15  4:02  AM  Result Value Ref Range   aPTT 32 24 - 36 seconds  Urinalysis, Routine w reflex microscopic     Status: Abnormal   Collection Time: 12/09/15  4:38 AM  Result Value Ref Range   Color, Urine YELLOW YELLOW   APPearance CLEAR CLEAR   Specific Gravity, Urine 1.014 1.005 - 1.030   pH 5.0 5.0 - 8.0   Glucose, UA NEGATIVE NEGATIVE mg/dL   Hgb urine dipstick NEGATIVE NEGATIVE   Bilirubin Urine NEGATIVE NEGATIVE   Ketones, ur NEGATIVE NEGATIVE mg/dL   Protein, ur NEGATIVE NEGATIVE mg/dL   Nitrite NEGATIVE NEGATIVE   Leukocytes, UA SMALL (A) NEGATIVE  Urine microscopic-add on     Status: Abnormal   Collection Time: 12/09/15  4:38 AM  Result Value Ref Range   Squamous Epithelial / LPF 0-5 (A) NONE SEEN   WBC, UA 0-5 0 - 5 WBC/hpf   RBC / HPF NONE SEEN 0 - 5 RBC/hpf   Bacteria, UA FEW (A) NONE SEEN   Dg Chest 1 View  Result Date: 12/09/2015 CLINICAL DATA:  [place] tonight. History of COPD, hypertension, diabetes, LEFT breast cancer. EXAM: CHEST 1 VIEW COMPARISON:  Chest radiograph April 29, 2008 FINDINGS: Cardiomediastinal silhouette is normal for this apical lordotic technique. Calcified aortic knob. Large hiatal hernia. Hepatic eventration. No pneumothorax. Osteopenia. IMPRESSION: No acute cardiopulmonary process considering apical lordotic technique. Large hiatal hernia. Electronically Signed   By: Elon Alas M.D.   On: 12/09/2015 01:56   Dg Knee Complete 4 Views Left  Result Date: 12/09/2015 CLINICAL DATA:  Fall at home in her den onto carpeted floor. Bilateral knee pain. EXAM: LEFT KNEE - COMPLETE 4+ VIEW COMPARISON:  None. FINDINGS: No fracture or dislocation. The alignment is maintained. Mild medial tibial femoral joint space narrowing. No joint effusion. Small quadriceps enthesophyte. There is mild prepatellar soft tissue edema. Vascular calcifications are seen. IMPRESSION: No acute fracture or subluxation. Mild medial tibial femoral joint space narrowing. Electronically Signed    By: Jeb Levering M.D.   On: 12/09/2015 01:54   Dg Knee Complete 4 Views Right  Result Date: 12/09/2015 CLINICAL DATA:  Fall at  home in her den onto carpeted floor. Bilateral knee pain. EXAM: RIGHT KNEE - COMPLETE 4+ VIEW COMPARISON:  None. FINDINGS: Displaced angulated distal femur fracture just proximal to right knee arthroplasty. Approximately 2 cm of impaction. There is apex lateral angulation and subluxation of the distal fracture fragment. Fracture extends to the femoral component of the arthroplasty anteriorly. There is been patellar resurfacing. Tibial component of the arthroplasty is intact. There is a lipohemarthrosis. Vascular calcifications are seen. IMPRESSION: Displaced angulated periprosthetic fracture of the distal femur extending to the femoral component of the right knee arthroplasty. Lipohemarthrosis, small to moderate in size. Electronically Signed   By: Jeb Levering M.D.   On: 12/09/2015 01:57    ROS  Blood pressure (!) 115/53, pulse (!) 50, temperature 97.5 F (36.4 C), temperature source Oral, resp. rate 13, SpO2 100 %. Physical Exam Healthy appearing, moderate distress, AAO, neck and back nontender, shoulders and arms with pain free AROM, 5/5 motor strength and intact sensation Right LE with swollen and flexed knee, good pulses and sensation distally, left LE with pain free AROM and no deformity   Assessment/Plan Right displaced periprosthetic distal femur fracture.  Plan admission and likely ORIF with locking plate vs IM nail.  Patient is from [place] but due to the unstable nature of this injury and the pain she will need to have this repaired here locally.  Will reach out to one of our total knee replacement specialists Dr Alvan Dame and Dr Lyla Glassing for repair.  Plavix complicates the timing of surgery some and may dictate a slight delay in surgery. Mechanical DVT prophylaxis. [facility] admitting this patient with complex medical issues which appear to  be stable at this point thankfully. Will immobilize in a brace temporarily.  Augustin Schooling, MD 12/09/2015, 5:47 AM

## 2015-12-09 NOTE — Op Note (Signed)
NAMEJANARIAH, [name] NO.:  [id number]  MEDICAL RECORD NO.:  [id number]  LOCATION:  [facility]                         FACILITY:  [facility]  PHYSICIAN:  Rod Can, MD     DATE OF BIRTH:  1925/12/08  DATE OF PROCEDURE:  12/09/2015 DATE OF DISCHARGE:                              OPERATIVE REPORT   SURGEON:  Rod Can, MD.  ASSISTANT:  Laure Kidney, RNFA.  PREOPERATIVE DIAGNOSIS:  Right periprosthetic distal femur fracture.  POSTOPERATIVE DIAGNOSIS:  Right periprosthetic distal femur fracture.  PROCEDURE PERFORMED:  Minimally invasive plate osteosynthesis of right periprosthetic distal femur fracture.  IMPLANTS:  Zimmer NCB distal femur periprosthetic plate 15 hole with 5 mm screws and locking caps x4.  ANESTHESIA: General plus adductor canal block.  EBL:  75 mL.  COMPLICATIONS:  None.  TUBES AND DRAINS:  None.  ANTIBIOTICS:  900 mg clindamycin.  1 g of vancomycin.  DISPOSITION:  Stable to PACU.  INDICATIONS:  The patient is an 82 year old female, who has a previous history of a right total knee replacement done in [place] about 11-12 years ago.  She had a well-functioning knee replacement.  She tripped and fell at home earlier today.  She had deformity, pain, and inability to weight bear.  She was brought to the emergency department where workup revealed a periprosthetic distal femur fracture.  She was seen by the hospitalist underwent perioperative risk stratification, medical optimization.  Risks, benefits, alternatives to plate fixation were explained and they elected to proceed.  DESCRIPTION OF PROCEDURE IN DETAIL:  I identified the patient in the holding area using 2 identifiers.  The surgical site was marked by myself.  She was taken to the operating room.  General anesthesia was induced.  A Foley catheter was placed.  She was transferred to the operating room table.  All bony prominences were well padded.  A bump was placed under the  right hip.  The right lower extremity was prepped and draped in normal sterile surgical fashion.  Time-out was called verifying side and site of surgery.  She did receive IV antibiotics within 60 minutes of beginning the procedure.  I began by making a 5 inch incision over the distal femur directly lateral.  Blunt dissection was performed untill I reached the IT band.  I split the IT band in line with the fibers.  I used a Cobb to perform periosteal elevation of the shaft of the femur.  I selected a 15 hole plate.  This was attached to the out rigger jig.  I reduced the fracture with standard traction, varus valgus force, and rotation.  I slid the plate subperiosteally up the femur.  I used AP and lateral fluoroscopy views to confirm plate placement and fracture reduction.  I secured the plate proximally with a drill bit through the outrigger and distally with a K-wire.  I then placed a total of 4 bicortical screws distally into the articular block. I then turned my attention proximally.  I placed a total of 4 bicortical screws with appropriate space to increase the working length of the construct.  I then removed the K-wire distally.  I placed 1 additional bicortical screw.  I  then placed 4 locking caps distally.  Final AP and lateral fluoroscopy views were used to confirm fracture reduction, which was near anatomic and hardware placement.  The wounds were copiously irrigated with saline.  I closed the IT band with #1 Vicryl.  I then closed the deep dermal layer with 2-0 interrupted Monocryl, 3-0 Monocryl subcuticular stitch followed by glue for the skin.  Once the glue was fully hardened, sterile dressing was applied followed by a bulky dressing and an Ace wrap.  Adductor canal block was placed by anesthesia  For postoperative pain control. The patient was then extubated taken to PACU in stable condition.  Sponge, needle, and instrument counts were correct at the end of the case x2.   There were no known complications.  We will readmit the patient to the hospitalist.  She will begin working with physical and occupational therapy.  She will be touchdown weightbearing to the right lower extremity.  We will place her on Lovenox for DVT prophylaxis.  She will need disposition planning.  I will see her back in the office 2 weeks after discharge. After surgery, She had a triphasic PT doppler signal, equivalent to preop.          ______________________________ Rod Can, MD     BS/MEDQ  D:  12/09/2015  T:  12/09/2015  Job:  [id number]

## 2015-12-09 NOTE — Transfer of Care (Signed)
Immediate Anesthesia Transfer of Care Note  Patient: [name]  Procedure(s) Performed: Procedure(s): ORIF OF DISTAL FEMUR (Right)  Patient Location: PACU  Anesthesia Type:General and GA combined with regional for post-op pain  Level of Consciousness: awake  Airway & Oxygen Therapy: Patient Spontanous Breathing and Patient connected to face mask oxygen  Post-op Assessment: Report given to RN and Post -op Vital signs reviewed and stable  Post vital signs: Reviewed and stable  Last Vitals:  Vitals:   12/09/15 1226 12/09/15 1550  BP: 122/56 (!) 133/53  Pulse: (!) 55 (!) 59  Resp: 14 11  Temp:      Last Pain:  Vitals:   12/09/15 1036  TempSrc: Oral  PainSc:       Patients Stated Pain Goal: 6 ([id number])  Complications: No apparent anesthesia complications

## 2015-12-09 NOTE — Anesthesia Preprocedure Evaluation (Signed)
Anesthesia Evaluation  Patient identified by MRN, date of birth, ID band Patient awake    Reviewed: Allergy & Precautions, NPO status , Patient's Chart, lab work & pertinent test results  History of Anesthesia Complications Negative for: history of anesthetic complications  Airway Mallampati: II  TM Distance: >3 FB Neck ROM: Full    Dental  (+) Teeth Intact   Pulmonary asthma , COPD,  COPD inhaler and oxygen dependent, former smoker,    breath sounds clear to auscultation       Cardiovascular hypertension, Pt. on medications and Pt. on home beta blockers + CAD and + Cardiac Stents  (-) dysrhythmias  Rhythm:Regular     Neuro/Psych negative neurological ROS  negative psych ROS   GI/Hepatic negative GI ROS, Neg liver ROS,   Endo/Other  diabetes  Renal/GU Renal InsufficiencyRenal disease     Musculoskeletal  (+) Arthritis ,   Abdominal   Peds  Hematology negative hematology ROS (+)   Anesthesia Other Findings   Reproductive/Obstetrics                             Anesthesia Physical Anesthesia Plan  ASA: III  Anesthesia Plan: General   Post-op Pain Management:    Induction: Intravenous  Airway Management Planned: Oral ETT  Additional Equipment: None  Intra-op Plan:   Post-operative Plan: Extubation in OR and Possible Post-op intubation/ventilation  Informed Consent: I have reviewed the patients History and Physical, chart, labs and discussed the procedure including the risks, benefits and alternatives for the proposed anesthesia with the patient or authorized representative who has indicated his/her understanding and acceptance.   Dental advisory given  Plan Discussed with: CRNA  Anesthesia Plan Comments: (Possible a line, type and screen)        Anesthesia Quick Evaluation

## 2015-12-10 DIAGNOSIS — N183 Chronic kidney disease, stage 3 (moderate): Secondary | ICD-10-CM

## 2015-12-10 LAB — CBC
HCT: 31.3 % — ABNORMAL LOW (ref 36.0–46.0)
HEMOGLOBIN: 9.7 g/dL — AB (ref 12.0–15.0)
MCH: 28.4 pg (ref 26.0–34.0)
MCHC: 31 g/dL (ref 30.0–36.0)
MCV: 91.5 fL (ref 78.0–100.0)
PLATELETS: 90 10*3/uL — AB (ref 150–400)
RBC: 3.42 MIL/uL — AB (ref 3.87–5.11)
RDW: 14.7 % (ref 11.5–15.5)
WBC: 5 10*3/uL (ref 4.0–10.5)

## 2015-12-10 LAB — GLUCOSE, CAPILLARY
GLUCOSE-CAPILLARY: 168 mg/dL — AB (ref 65–99)
GLUCOSE-CAPILLARY: 145 mg/dL — AB (ref 65–99)
Glucose-Capillary: 181 mg/dL — ABNORMAL HIGH (ref 65–99)
Glucose-Capillary: 134 mg/dL — ABNORMAL HIGH (ref 65–99)
Glucose-Capillary: 134 mg/dL — ABNORMAL HIGH (ref 65–99)
Glucose-Capillary: 165 mg/dL — ABNORMAL HIGH (ref 65–99)

## 2015-12-10 LAB — BASIC METABOLIC PANEL
Anion gap: 8 (ref 5–15)
BUN: 33 mg/dL — AB (ref 6–20)
CHLORIDE: 105 mmol/L (ref 101–111)
CO2: 25 mmol/L (ref 22–32)
CREATININE: 2.14 mg/dL — AB (ref 0.44–1.00)
Calcium: 8.4 mg/dL — ABNORMAL LOW (ref 8.9–10.3)
GFR, EST AFRICAN AMERICAN: 23 mL/min — AB (ref >60)
GFR, EST NON AFRICAN AMERICAN: 20 mL/min — AB (ref >60)
Glucose, Bld: 157 mg/dL — ABNORMAL HIGH (ref 65–99)
POTASSIUM: 4.8 mmol/L (ref 3.5–5.1)
SODIUM: 138 mmol/L (ref 135–145)

## 2015-12-10 LAB — HEMOGLOBIN A1C
Hgb A1c MFr Bld: 6.3 % — ABNORMAL HIGH (ref 4.8–5.6)
Mean Plasma Glucose: 134 mg/dL

## 2015-12-10 MED ORDER — BUPIVACAINE-EPINEPHRINE (PF) 0.5% -1:200000 IJ SOLN
INTRAMUSCULAR | Status: DC | PRN
  Administered 2015-12-09: 30 mL via PERINEURAL

## 2015-12-10 MED ORDER — SODIUM CHLORIDE 0.9 % IV BOLUS (SEPSIS)
500.0000 mL | Freq: Once | INTRAVENOUS | Status: AC
  Administered 2015-12-10: 500 mL via INTRAVENOUS

## 2015-12-10 MED ORDER — INSULIN ASPART 100 UNIT/ML ~~LOC~~ SOLN
0.0000 [IU] | Freq: Three times a day (TID) | SUBCUTANEOUS | Status: DC
  Administered 2015-12-10: 1 [IU] via SUBCUTANEOUS
  Administered 2015-12-10: 2 [IU] via SUBCUTANEOUS
  Administered 2015-12-10: 1 [IU] via SUBCUTANEOUS
  Administered 2015-12-11: 2 [IU] via SUBCUTANEOUS
  Administered 2015-12-11: 3 [IU] via SUBCUTANEOUS
  Administered 2015-12-11: 1 [IU] via SUBCUTANEOUS
  Administered 2015-12-11: 2 [IU] via SUBCUTANEOUS
  Administered 2015-12-12: 1 [IU] via SUBCUTANEOUS
  Administered 2015-12-12: 2 [IU] via SUBCUTANEOUS
  Administered 2015-12-12: 3 [IU] via SUBCUTANEOUS
  Administered 2015-12-12: 2 [IU] via SUBCUTANEOUS
  Administered 2015-12-13: 5 [IU] via SUBCUTANEOUS
  Administered 2015-12-13: 2 [IU] via SUBCUTANEOUS
  Administered 2015-12-13: 1 [IU] via SUBCUTANEOUS
  Administered 2015-12-13: 5 [IU] via SUBCUTANEOUS
  Administered 2015-12-14 – 2015-12-15 (×5): 2 [IU] via SUBCUTANEOUS
  Administered 2015-12-15: 3 [IU] via SUBCUTANEOUS
  Administered 2015-12-15: 7 [IU] via SUBCUTANEOUS
  Administered 2015-12-16: 3 [IU] via SUBCUTANEOUS
  Administered 2015-12-16: 5 [IU] via SUBCUTANEOUS
  Administered 2015-12-16 (×2): 2 [IU] via SUBCUTANEOUS
  Administered 2015-12-17 (×2): 5 [IU] via SUBCUTANEOUS

## 2015-12-10 MED ORDER — PREDNISOLONE ACETATE 1 % OP SUSP
1.0000 [drp] | Freq: Two times a day (BID) | OPHTHALMIC | Status: DC
  Administered 2015-12-10 – 2015-12-17 (×14): 1 [drp] via OPHTHALMIC
  Filled 2015-12-10 (×2): qty 1

## 2015-12-10 NOTE — Progress Notes (Signed)
OT Cancellation Note  Patient Details Name: [name] MRN: [id number] DOB: January 26, 1926   Cancelled Treatment:    Reason Eval/Treat Not Completed: Other (comment). Pt is Medicare and current D/C plan is SNF. No apparent immediate acute care OT needs, therefore will defer OT to SNF. If OT eval is needed please call Acute Rehab Dept. at [phone] or text page OT at [phone].   [place], OTR/L  [id number] 12/10/2015 12/10/2015, 8:13 AM

## 2015-12-10 NOTE — Anesthesia Procedure Notes (Signed)
Anesthesia Regional Block:  Adductor canal block  Pre-Anesthetic Checklist: ,, timeout performed, Correct Patient, Correct Site, Correct Laterality, Correct Procedure, Correct Position, site marked, Risks and benefits discussed,  Surgical consent,  Pre-op evaluation,  At surgeon's request and post-op pain management  Laterality: Right  Prep: chloraprep       Needles:  Injection technique: Single-shot  Needle Type: Echogenic Needle     Needle Length: 9cm 9 cm Needle Gauge: 21 and 21 G    Additional Needles:  Procedures: ultrasound guided (picture in chart) Adductor canal block Narrative:  Injection made incrementally with aspirations every 5 mL.  Performed by: Personally  Anesthesiologist: TURK, STEPHEN EDWARD  Additional Notes: No pain on injection. No increased resistance to injection. Injection made in 5cc increments.  Good needle visualization.  Patient tolerated procedure well.      

## 2015-12-10 NOTE — Addendum Note (Signed)
Addendum  created 12/10/15 0810 by Catalina Gravel, MD   Anesthesia Intra Blocks edited, Anesthesia Intra Meds edited, Child order released for a procedure order, Image imported, Sign clinical note

## 2015-12-10 NOTE — Evaluation (Signed)
Physical Therapy Evaluation Patient Details Name: [name] MRN: [id number] DOB: 1926-03-28 Today's Date: 12/10/2015   History of Present Illness  82 y.o. female who fell at home and sustained a distal Rt femur fx. Pt now s/p ORIF on 12/09/15 ORIF. PMH: Rt foot fx 10/2015, Rt TKA, HTN, diabetes, COPD   Clinical Impression  Pt having difficulty with mobility during initial PT evaluation and treatment. Pt able to sit EOB with assistance but was unable to stand despite multiple attempts and physical assistance. Based upon the patient's current mobility, anticipate D/C to SNF for further rehabilitation following her acute stay. Pt will be at w/c level for mobility. PT to continue to follow and advance as tolerated.     Follow Up Recommendations SNF;Supervision for mobility/OOB    Equipment Recommendations  None recommended by PT;Other (comment) (to be addressed at next venue)    Recommendations for Other Services       Precautions / Restrictions Precautions Precautions: Fall Restrictions Weight Bearing Restrictions: Yes RLE Weight Bearing: Touchdown weight bearing (per physician note)      Mobility  Bed Mobility Overal bed mobility: Needs Assistance Bed Mobility: Supine to Sit;Sit to Supine     Supine to sit: Mod assist Sit to supine: Mod assist   General bed mobility comments: Mod assist with Rt LE and trunk to come to sitting, pt able to assist with rail.   Transfers Overall transfer level: Needs assistance Equipment used: Rolling walker (2 wheeled) Transfers: Sit to/from Stand           General transfer comment: attempted sit<>stand X2, unable to clear buttock from bed despite max assist.   Ambulation/Gait                Stairs            Wheelchair Mobility    Modified Rankin (Stroke Patients Only)       Balance Overall balance assessment: Needs assistance Sitting-balance support: No upper extremity supported Sitting balance-Leahy Scale:  Fair                                       Pertinent Vitals/Pain Pain Assessment: Faces Pain Score: 0-No pain (at rest) Faces Pain Scale: Hurts little more Pain Location: Rt knee/thigh Pain Descriptors / Indicators: Guarding;Grimacing Pain Intervention(s): Monitored during session;Limited activity within patient's tolerance;Ice applied    Home Living Family/patient expects to be discharged to:: Skilled nursing facility Living Arrangements: Alone               Additional Comments: Lives in [place], was here visiting her daughter.     Prior Function Level of Independence: Independent with assistive device(s)         Comments: Was wearing CAM walking boot prior to recent fall, used SPC when walking outside but no device inside the home. 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      Hand Dominance        Extremity/Trunk Assessment   Upper Extremity Assessment: Generalized weakness           Lower Extremity Assessment: Generalized weakness;RLE deficits/detail RLE Deficits / Details: assist needed to move RT LE        Communication   Communication: No difficulties  Cognition Arousal/Alertness: Awake/alert Behavior During Therapy: WFL for tasks assessed/performed Overall Cognitive Status: Within Functional Limits for tasks assessed  General Comments      Exercises        Assessment/Plan    PT Assessment Patient needs continued PT services  PT Diagnosis Difficulty walking;Generalized weakness   PT Problem List Decreased strength;Decreased range of motion;Decreased balance;Decreased activity tolerance;Decreased mobility  PT Treatment Interventions DME instruction;Gait training;Functional mobility training;Therapeutic activities;Therapeutic exercise;Balance training;Patient/family education;Wheelchair mobility training   PT Goals (Current goals can be found in the Care Plan section) Acute Rehab PT Goals Patient  Stated Goal: get back to being independent PT Goal Formulation: With patient Time For Goal Achievement: 12/24/15 Potential to Achieve Goals: Good    Frequency Min 3X/week   Barriers to discharge        Co-evaluation               End of Session Equipment Utilized During Treatment: Gait belt Activity Tolerance: Patient tolerated treatment well Patient left: in bed;with call bell/phone within reach;with bed alarm set;with SCD's reapplied Nurse Communication: Mobility status         Time: [id number] PT Time Calculation (min) (ACUTE ONLY): 46 min   Charges:   PT Evaluation $PT Eval Moderate Complexity: 1 Procedure PT Treatments $Therapeutic Activity: 23-37 mins   PT G Codes:        Cassell Clement, PT, CSCS Pager [phone] Office [phone]  12/10/2015, 3:15 PM

## 2015-12-10 NOTE — Progress Notes (Signed)
   Subjective:  Patient reports pain as mild to moderate.  No c/o.  Objective:   VITALS:   Vitals:   12/10/15 0441 12/10/15 0455 12/10/15 0500 12/10/15 0708  BP: (!) 97/37 (!) 106/50 (!) 116/48 (!) 106/48  Pulse: (!) 55 (!) 58  (!) 56  Resp: 18 18    Temp: 97.6 F (36.4 C)     TempSrc: Oral     SpO2: 98%     Weight:      Height:        ABD soft Sensation intact distally Dorsiflexion/Plantar flexion intact Incision: dressing C/D/I Compartment soft Foot warm Triphasic PT doppler signal - unchanged from preop   Lab Results  Component Value Date   WBC 5.0 12/10/2015   HGB 9.7 (L) 12/10/2015   HCT 31.3 (L) 12/10/2015   MCV 91.5 12/10/2015   PLT 90 (L) 12/10/2015   BMET    Component Value Date/Time   NA 138 12/10/2015 0351   K 4.8 12/10/2015 0351   CL 105 12/10/2015 0351   CO2 25 12/10/2015 0351   GLUCOSE 157 (H) 12/10/2015 0351   BUN 33 (H) 12/10/2015 0351   CREATININE 2.14 (H) 12/10/2015 0351   CALCIUM 8.4 (L) 12/10/2015 0351   GFRNONAA 20 (L) 12/10/2015 0351   GFRAA 23 (L) 12/10/2015 0351     Assessment/Plan: 1 Day Post-Op   Principal Problem:   Right femoral fracture (HCC) Active Problems:   CAD in native artery   CKD stage G3a/A3, GFR 45 - 59 and albumin creatinine ratio >300 mg/g   Right femoral fracture, closed, initial encounter   Supracondylar fracture of femur (HCC)   NWB RLE PT/OT for bed --> chair xfers DVT ppx: lovenox x30 days, resume plavix PO pain control D/C planning, SNF placement   Jermarcus Mcfadyen, Horald Pollen 12/10/2015, 7:42 AM   Rod Can, MD Cell [phone]

## 2015-12-10 NOTE — Progress Notes (Signed)
[facility] PROGRESS NOTE  [name] [id number] DOB: 02-17-1926 DOA: 12/08/2015 PCP: Pcp Not In System  Assessment/Plan:  1. Mechanical fall with closed right femoral neck fracture. Mgmt per ortho  2.CAD with stent placement in 2009. Chest pain-free, EKG nonacute, fairly active climbs one to 2 flights of stairs without any chest pain or shortness of breath, currently compensated.  Plavix restarted. Continue home dose beta blocker. As needed IV Lopressor and hydralazine for blood pressure control.  3. DM type II. Hold Lantus, check A1c, ACHS SSI  4. Essential hypertension. Home medications continued, IV Lopressor and hydralazine as needed.  5. CK D stage III. Last creatinine about 7 years ago is 1.3 in our system, currently creatinine is close to 2, this could be her new baseline. Cont gently hydrate and monitor for now. Thereafter outpatient follow-up with PCP in [place].  6. Dyslipidemia. Continue home dose statin.    DVT Prophylaxis  SCDs   AM Labs Ordered, also please review Full Orders  Family Communication: Admission, patients condition and plan of care including tests being ordered have been discussed with the patient who indicates understanding and agree with the plan and Code Status.  Code Status full  Likely DC to  SNF in 2-3 days  Condition fair  Consults called: Orthopedics Dr. Veverly Fells  Admission status: Inpt    Time spent in minutes : 35  HPI/Subjective: Pain controlled. No N/V. Denies SOB.  Objective: Vitals:   12/10/15 1000 12/10/15 1500  BP: (!) 106/42 (!) 102/37  Pulse: (!) 54 60  Resp:  17  Temp:  97.8 F (36.6 C)    Intake/Output Summary (Last 24 hours) at 12/10/15 1912 Last data filed at 12/10/15 1330  Gross per 24 hour  Intake             2446 ml  Output              250 ml  Net             2196 ml   Filed Weights   12/09/15 1651  Weight: 79.4 kg (175 lb)    Exam:  General:  No diaphoresis, anxious, no  acute distress Cardiovascular: Regular rate and rhythm no murmurs rubs or gallops Respiratory: Clear to auscultation bilaterally no more breathing Abdomen: Nondistended bowel sounds normal nontender palpation Musculoskeletal: Moving all extremities, no deformity, 5 out of 5 strength; Dressing on RLE CDI, mild induration at surgical site  Data Reviewed: Basic Metabolic Panel:  Recent Labs Lab 12/09/15 0402 12/10/15 0351  NA 137 138  K 4.7 4.8  CL 103 105  CO2 25 25  GLUCOSE 182* 157*  BUN 40* 33*  CREATININE 2.04* 2.14*  CALCIUM 8.8* 8.4*   Liver Function Tests: No results for input(s): AST, ALT, ALKPHOS, BILITOT, PROT, ALBUMIN in the last 168 hours. No results for input(s): LIPASE, AMYLASE in the last 168 hours. No results for input(s): AMMONIA in the last 168 hours. CBC:  Recent Labs Lab 12/09/15 0402 12/10/15 0351  WBC 8.9 5.0  NEUTROABS 7.3  --   HGB 11.8* 9.7*  HCT 36.3 31.3*  MCV 88.3 91.5  PLT 150 90*   Cardiac Enzymes: No results for input(s): CKTOTAL, CKMB, CKMBINDEX, TROPONINI in the last 168 hours. BNP (last 3 results) No results for input(s): BNP in the last 8760 hours.  ProBNP (last 3 results) No results for input(s): PROBNP in the last 8760 hours.  CBG:  Recent Labs Lab 12/09/15 2223 12/10/15 0140 12/10/15 [id number]  12/10/15 1123 12/10/15 1655  GLUCAP 181* 168* 134* 134* 145*    Recent Results (from the past 240 hour(s))  Surgical PCR screen     Status: None   Collection Time: 12/09/15  4:04 PM  Result Value Ref Range Status   MRSA, PCR NEGATIVE NEGATIVE Final   Staphylococcus aureus NEGATIVE NEGATIVE Final    Comment:        The Xpert SA Assay (FDA approved for NASAL specimens in patients over 15 years of age), is one component of a comprehensive surveillance program.  Test performance has been validated by [facility] for patients greater than or equal to 5 year old. It is not intended to diagnose infection nor to guide or monitor  treatment.      Studies: Dg Chest 1 View  Result Date: 12/09/2015 CLINICAL DATA:  [place] tonight. History of COPD, hypertension, diabetes, LEFT breast cancer. EXAM: CHEST 1 VIEW COMPARISON:  Chest radiograph April 29, 2008 FINDINGS: Cardiomediastinal silhouette is normal for this apical lordotic technique. Calcified aortic knob. Large hiatal hernia. Hepatic eventration. No pneumothorax. Osteopenia. IMPRESSION: No acute cardiopulmonary process considering apical lordotic technique. Large hiatal hernia. Electronically Signed   By: Elon Alas M.D.   On: 12/09/2015 01:56   Ct Femur Right Wo Contrast  Result Date: 12/09/2015 CLINICAL DATA:  Acute distal right femur fracture. EXAM: CT OF THE RIGHT FEMUR WITHOUT CONTRAST TECHNIQUE: Multidetector CT imaging was performed according to the standard protocol. Multiplanar CT image reconstructions were also generated. COMPARISON:  Radiographs dated 12/09/2015 FINDINGS: There is a comminuted fracture of the distal femoral metaphysis just above the femoral component of the total knee prosthesis. There is a 15 mm of posterior displacement of the distal fragment. The fracture does not extend into the femoral condyles. The patella is intact. Proximal tibia and fibula are intact. No loosening of the prosthesis components. Small amount of hemorrhage into the adjacent soft tissues at the fracture site. The proximal femur appears normal. Minimal marginal osteophytes on the right acetabulum. IMPRESSION: Comminuted distal right femoral metaphysis with 15 mm of posterior displacement of the distal fragment. No extension into the femoral condyles. Electronically Signed   By: Lorriane Shire M.D.   On: 12/09/2015 08:27   Dg Knee Complete 4 Views Left  Result Date: 12/09/2015 CLINICAL DATA:  Fall at home in her den onto carpeted floor. Bilateral knee pain. EXAM: LEFT KNEE - COMPLETE 4+ VIEW COMPARISON:  None. FINDINGS: No fracture or dislocation. The alignment is maintained.  Mild medial tibial femoral joint space narrowing. No joint effusion. Small quadriceps enthesophyte. There is mild prepatellar soft tissue edema. Vascular calcifications are seen. IMPRESSION: No acute fracture or subluxation. Mild medial tibial femoral joint space narrowing. Electronically Signed   By: Jeb Levering M.D.   On: 12/09/2015 01:54   Dg Knee Complete 4 Views Right  Result Date: 12/09/2015 CLINICAL DATA:  Fall at home in her den onto carpeted floor. Bilateral knee pain. EXAM: RIGHT KNEE - COMPLETE 4+ VIEW COMPARISON:  None. FINDINGS: Displaced angulated distal femur fracture just proximal to right knee arthroplasty. Approximately 2 cm of impaction. There is apex lateral angulation and subluxation of the distal fracture fragment. Fracture extends to the femoral component of the arthroplasty anteriorly. There is been patellar resurfacing. Tibial component of the arthroplasty is intact. There is a lipohemarthrosis. Vascular calcifications are seen. IMPRESSION: Displaced angulated periprosthetic fracture of the distal femur extending to the femoral component of the right knee arthroplasty. Lipohemarthrosis, small to moderate in size.  Electronically Signed   By: Jeb Levering M.D.   On: 12/09/2015 01:57   Dg C-arm 61-120 Min  Result Date: 12/09/2015 CLINICAL DATA:  ORIF for distal femur fracture. EXAM: RIGHT FEMUR PORTABLE 1 VIEW; DG C-ARM 61-120 MIN COMPARISON:  Earlier the same day. FINDINGS: Image 1 shows the distal right femur fracture which is demonstrated and lateral projection on image 2. Image 3 shows a long cortical plate and screw fixation device with 3 or 4 metaphyseal screws. Image 4 shows the proximal end of the cortical plate. Images 5 and 6 shows a plate and lateral projection. IMPRESSION: Status post ORIF for distal femur fracture this patient status post total knee replacement. No evidence for immediate hardware complications. Electronically Signed   By: Misty Stanley M.D.   On:  12/09/2015 20:22   Dg Femur [place], [place] 2 Views Right  Result Date: 12/10/2015 CLINICAL DATA:  Postoperative radiographs, status post right femoral plate placement. EXAM: RIGHT FEMUR PORTABLE 1 VIEW COMPARISON:  Intraoperative radiographs performed earlier today at 6:40 p.m. FINDINGS: There has been placement of a plate and screws along the right femur, transfixing the distal femoral fracture in near anatomic alignment. A total knee arthroplasty is grossly unremarkable in appearance. No new fractures are identified. The right femoral head remains seated at the acetabulum. Postoperative soft tissue changes are seen. Scattered vascular calcifications are seen. IMPRESSION: Status post internal fixation of distal femoral fracture in near anatomic alignment. Total knee arthroplasty is grossly unremarkable in appearance. Electronically Signed   By: Garald Balding M.D.   On: 12/10/2015 00:02   Dg Femur [place], [place] 2 Views Right  Result Date: 12/09/2015 CLINICAL DATA:  ORIF for distal femur fracture. EXAM: RIGHT FEMUR PORTABLE 1 VIEW; DG C-ARM 61-120 MIN COMPARISON:  Earlier the same day. FINDINGS: Image 1 shows the distal right femur fracture which is demonstrated and lateral projection on image 2. Image 3 shows a long cortical plate and screw fixation device with 3 or 4 metaphyseal screws. Image 4 shows the proximal end of the cortical plate. Images 5 and 6 shows a plate and lateral projection. IMPRESSION: Status post ORIF for distal femur fracture this patient status post total knee replacement. No evidence for immediate hardware complications. Electronically Signed   By: Misty Stanley M.D.   On: 12/09/2015 20:22    Scheduled Meds: . arformoterol  15 mcg Nebulization Q12H  . budesonide  0.25 mg Nebulization Daily  . clopidogrel  75 mg Oral Daily  . docusate sodium  100 mg Oral BID  . enoxaparin (LOVENOX) injection  30 mg Subcutaneous Q24H  . ferrous sulfate  325 mg Oral Q breakfast  . furosemide  40 mg Oral  Daily  . insulin aspart  0-9 Units Subcutaneous TID WC & HS  . labetalol  100 mg Oral BID  . NIFEdipine  30 mg Oral BID  . pravastatin  20 mg Oral q1800  . prednisoLONE acetate  1 drop Both Eyes BID  . senna  1 tablet Oral BID   Continuous Infusions: . sodium chloride 10 mL/hr at 12/10/15 1309    Principal Problem:   Right femoral fracture (HCC) Active Problems:   CAD in native artery   CKD stage G3a/A3, GFR 45 - 59 and albumin creatinine ratio >300 mg/g   Right femoral fracture, closed, initial encounter   Supracondylar fracture of femur ([place])    Time spent: [place] [facility] Pager [place]. If 7PM-7AM, please contact  night-coverage at [email or web address], password [facility] 12/10/2015, 7:12 PM  LOS: 1 day

## 2015-12-10 NOTE — Plan of Care (Signed)
Problem: Pain Managment: Goal: General experience of comfort will improve Outcome: Progressing Medicated twice for pain this shift  Problem: Physical Regulation: Goal: Will remain free from infection Outcome: Progressing No S/S of infection noted  Problem: Skin Integrity: Goal: Risk for impaired skin integrity will decrease Outcome: Progressing Scattered bruises noted on the arms  Problem: Tissue Perfusion: Goal: Risk factors for ineffective tissue perfusion will decrease Outcome: Progressing No S/S of DVT noted, SCDs are on  Problem: Activity: Goal: Risk for activity intolerance will decrease Outcome: Progressing Patient remains in the bed  Problem: Bowel/Gastric: Goal: Will not experience complications related to bowel motility Outcome: Progressing No bowel issues reported

## 2015-12-11 ENCOUNTER — Encounter (HOSPITAL_COMMUNITY): Payer: Self-pay | Admitting: Orthopedic Surgery

## 2015-12-11 DIAGNOSIS — I2510 Atherosclerotic heart disease of native coronary artery without angina pectoris: Secondary | ICD-10-CM

## 2015-12-11 DIAGNOSIS — E785 Hyperlipidemia, unspecified: Secondary | ICD-10-CM

## 2015-12-11 LAB — GLUCOSE, CAPILLARY
GLUCOSE-CAPILLARY: 188 mg/dL — AB (ref 65–99)
GLUCOSE-CAPILLARY: 206 mg/dL — AB (ref 65–99)
GLUCOSE-CAPILLARY: 199 mg/dL — AB (ref 65–99)
Glucose-Capillary: 146 mg/dL — ABNORMAL HIGH (ref 65–99)

## 2015-12-11 LAB — BASIC METABOLIC PANEL
ANION GAP: 8 (ref 5–15)
BUN: 35 mg/dL — AB (ref 6–20)
CHLORIDE: 104 mmol/L (ref 101–111)
CO2: 23 mmol/L (ref 22–32)
Calcium: 8.2 mg/dL — ABNORMAL LOW (ref 8.9–10.3)
Creatinine, Ser: 2.35 mg/dL — ABNORMAL HIGH (ref 0.44–1.00)
GFR calc Af Amer: 20 mL/min — ABNORMAL LOW (ref >60)
GFR calc non Af Amer: 18 mL/min — ABNORMAL LOW (ref >60)
GLUCOSE: 149 mg/dL — AB (ref 65–99)
POTASSIUM: 4.7 mmol/L (ref 3.5–5.1)
Sodium: 135 mmol/L (ref 135–145)

## 2015-12-11 LAB — CBC
HEMATOCRIT: 29.3 % — AB (ref 36.0–46.0)
Hemoglobin: 9.2 g/dL — ABNORMAL LOW (ref 12.0–15.0)
MCH: 28.9 pg (ref 26.0–34.0)
MCHC: 31.4 g/dL (ref 30.0–36.0)
MCV: 92.1 fL (ref 78.0–100.0)
Platelets: 98 10*3/uL — ABNORMAL LOW (ref 150–400)
RBC: 3.18 MIL/uL — AB (ref 3.87–5.11)
RDW: 14.7 % (ref 11.5–15.5)
WBC: 4.9 10*3/uL (ref 4.0–10.5)

## 2015-12-11 LAB — SODIUM, URINE, RANDOM

## 2015-12-11 MED ORDER — SODIUM CHLORIDE 0.9 % IV SOLN
INTRAVENOUS | Status: AC
  Administered 2015-12-11: 100 mL/h via INTRAVENOUS
  Administered 2015-12-12: 01:00:00 via INTRAVENOUS

## 2015-12-11 NOTE — Progress Notes (Signed)
[facility] PROGRESS NOTE  [name] [id number] DOB: 1925-06-22 DOA: 12/08/2015 PCP: Pcp Not In System  Assessment/Plan:  1. Mechanical fall with closed right femoral neck fracture. Mgmt per ortho  2.CAD with stent placement in 2009. Chest pain-free, EKG nonacute, fairly active climbs one to 2 flights of stairs without any chest pain or shortness of breath, currently compensated.  Plavix restarted. Continue home dose beta blocker. As needed IV Lopressor and hydralazine for blood pressure control.  3. DM type II. Hold Lantus, 6.3 A1c, ACHS SSI  4. Essential hypertension. Home medications continued, IV Lopressor and hydralazine as needed.  5. CK D stage III. Last creatinine about 7 years ago is 1.3 in our system, currently creatinine is close to 2, this could be her new baseline. Cont gently hydrate and monitor for now. Thereafter outpatient follow-up with PCP in [place]. Urine labs ordered Contact Dr. Joana Reamer MD [phone], tomorrow, pt had recent labs  6. Dyslipidemia. Continue home dose statin.    DVT Prophylaxis  SCDs   AM Labs Ordered, also please review Full Orders  Family Communication: Admission, patients condition and plan of care including tests being ordered have been discussed with the patient who indicates understanding and agree with the plan and Code Status.  Code Status full  Likely DC to  SNF tomorrow  Condition fair  Consults called: Orthopedics Dr. Veverly Fells  Admission status: Inpt    Time spent in minutes : 35  HPI/Subjective: Pain controlled. No N/V. Denies SOB.  Objective: Vitals:   12/11/15 0821 12/11/15 1540  BP: (!) 137/52 (!) 140/55  Pulse: 84 88  Resp:  19  Temp:      Intake/Output Summary (Last 24 hours) at 12/11/15 1848 Last data filed at 12/11/15 1743  Gross per 24 hour  Intake              600 ml  Output                0 ml  Net              600 ml   Filed Weights   12/09/15 1651   Weight: 79.4 kg (175 lb)    Exam:  General:  No diaphoresis, calm, nNAD Cardiovascular: Regular rate and rhythm no murmurs rubs or gallops Respiratory: Clear to auscultation bilaterally, normal breathing Abdomen: Nondistended bowel sounds normal nontender palpation Musculoskeletal: Moving all extremities, no deformity, 5 out of 5 strength; Dressing on RLE CDI, mild induration at surgical site, no erythema  Data Reviewed: Basic Metabolic Panel:  Recent Labs Lab 12/09/15 0402 12/10/15 0351 12/11/15 0431  NA 137 138 135  K 4.7 4.8 4.7  CL 103 105 104  CO2 25 25 23   GLUCOSE 182* 157* 149*  BUN 40* 33* 35*  CREATININE 2.04* 2.14* 2.35*  CALCIUM 8.8* 8.4* 8.2*   Liver Function Tests: No results for input(s): AST, ALT, ALKPHOS, BILITOT, PROT, ALBUMIN in the last 168 hours. No results for input(s): LIPASE, AMYLASE in the last 168 hours. No results for input(s): AMMONIA in the last 168 hours. CBC:  Recent Labs Lab 12/09/15 0402 12/10/15 0351 12/11/15 0431  WBC 8.9 5.0 4.9  NEUTROABS 7.3  --   --   HGB 11.8* 9.7* 9.2*  HCT 36.3 31.3* 29.3*  MCV 88.3 91.5 92.1  PLT 150 90* 98*   Cardiac Enzymes: No results for input(s): CKTOTAL, CKMB, CKMBINDEX, TROPONINI in the last 168 hours. BNP (last 3 results) No results for  input(s): BNP in the last 8760 hours.  ProBNP (last 3 results) No results for input(s): PROBNP in the last 8760 hours.  CBG:  Recent Labs Lab 12/10/15 1655 12/10/15 2200 12/11/15 0643 12/11/15 1143 12/11/15 1630  GLUCAP 145* 165* 146* 188* 206*    Recent Results (from the past 240 hour(s))  Surgical PCR screen     Status: None   Collection Time: 12/09/15  4:04 PM  Result Value Ref Range Status   MRSA, PCR NEGATIVE NEGATIVE Final   Staphylococcus aureus NEGATIVE NEGATIVE Final    Comment:        The Xpert SA Assay (FDA approved for NASAL specimens in patients over 96 years of age), is one component of a comprehensive surveillance program.   Test performance has been validated by [facility] for patients greater than or equal to 71 year old. It is not intended to diagnose infection nor to guide or monitor treatment.      Studies: Dg C-arm 61-120 Min  Result Date: 12/09/2015 CLINICAL DATA:  ORIF for distal femur fracture. EXAM: RIGHT FEMUR PORTABLE 1 VIEW; DG C-ARM 61-120 MIN COMPARISON:  Earlier the same day. FINDINGS: Image 1 shows the distal right femur fracture which is demonstrated and lateral projection on image 2. Image 3 shows a long cortical plate and screw fixation device with 3 or 4 metaphyseal screws. Image 4 shows the proximal end of the cortical plate. Images 5 and 6 shows a plate and lateral projection. IMPRESSION: Status post ORIF for distal femur fracture this patient status post total knee replacement. No evidence for immediate hardware complications. Electronically Signed   By: Misty Stanley M.D.   On: 12/09/2015 20:22   Dg Femur [place], [place] 2 Views Right  Result Date: 12/10/2015 CLINICAL DATA:  Postoperative radiographs, status post right femoral plate placement. EXAM: RIGHT FEMUR PORTABLE 1 VIEW COMPARISON:  Intraoperative radiographs performed earlier today at 6:40 p.m. FINDINGS: There has been placement of a plate and screws along the right femur, transfixing the distal femoral fracture in near anatomic alignment. A total knee arthroplasty is grossly unremarkable in appearance. No new fractures are identified. The right femoral head remains seated at the acetabulum. Postoperative soft tissue changes are seen. Scattered vascular calcifications are seen. IMPRESSION: Status post internal fixation of distal femoral fracture in near anatomic alignment. Total knee arthroplasty is grossly unremarkable in appearance. Electronically Signed   By: Garald Balding M.D.   On: 12/10/2015 00:02   Dg Femur [place], [place] 2 Views Right  Result Date: 12/09/2015 CLINICAL DATA:  ORIF for distal femur fracture. EXAM: RIGHT FEMUR PORTABLE 1  VIEW; DG C-ARM 61-120 MIN COMPARISON:  Earlier the same day. FINDINGS: Image 1 shows the distal right femur fracture which is demonstrated and lateral projection on image 2. Image 3 shows a long cortical plate and screw fixation device with 3 or 4 metaphyseal screws. Image 4 shows the proximal end of the cortical plate. Images 5 and 6 shows a plate and lateral projection. IMPRESSION: Status post ORIF for distal femur fracture this patient status post total knee replacement. No evidence for immediate hardware complications. Electronically Signed   By: Misty Stanley M.D.   On: 12/09/2015 20:22    Scheduled Meds: . arformoterol  15 mcg Nebulization Q12H  . budesonide  0.25 mg Nebulization Daily  . clopidogrel  75 mg Oral Daily  . docusate sodium  100 mg Oral BID  . enoxaparin (LOVENOX) injection  30 mg Subcutaneous Q24H  . ferrous sulfate  325 mg Oral Q breakfast  . furosemide  40 mg Oral Daily  . insulin aspart  0-9 Units Subcutaneous TID WC & HS  . labetalol  100 mg Oral BID  . NIFEdipine  30 mg Oral BID  . pravastatin  20 mg Oral q1800  . prednisoLONE acetate  1 drop Both Eyes BID  . senna  1 tablet Oral BID   Continuous Infusions: . sodium chloride 100 mL/hr (12/11/15 1452)    Principal Problem:   Right femoral fracture (HCC) Active Problems:   CAD in native artery   CKD stage G3a/A3, GFR 45 - 59 and albumin creatinine ratio >300 mg/g   Right femoral fracture, closed, initial encounter   Supracondylar fracture of femur ([place])    Time spent: [place] [facility] Pager [place]. If 7PM-7AM, please contact night-coverage at [email or web address], password [facility] 12/11/2015, 6:48 PM  LOS: 2 days

## 2015-12-11 NOTE — Clinical Social Work Note (Signed)
Clinical Social Work Assessment  Patient Details  Name: [name] MRN: [id number] Date of Birth: [birth year]  Date of referral:  12/11/15               Reason for consult:  Facility Placement                Permission sought to share information with:   (Facilities) Permission granted to share information::   (Facilities)  Name::        Agency::     Relationship::     Contact Information:     Housing/Transportation Living arrangements for the past 2 months:  Single Family Home (Patient states that she lives home alone in [place].) Source of Information:  Patient Patient Interpreter Needed:  None Criminal Activity/Legal Involvement Pertinent to Current Situation/Hospitalization:  No - Comment as needed Significant Relationships:  Adult Children Lives with:  Self Do you feel safe going back to the place where you live?   (Patient states that she is interested in rehab at [facility].) Need for family participation in patient care:     Care giving concerns:  Patient states that she is from [place]. Patient reports that she now needs assistance with ADL's due to fall. She states that she is interested in SNF.   Social Worker assessment / plan:  SW met with patient at bedside. Patient was alert and oriented. There was no family present. Patient states that she presents to [facility] hospital due to falling and losing her balance while trying to cut on the light. Patient states that she cannot put weight on her foot and is not able to walk at this time. Patient states that her daughter/[name] [phone] is her primary support. Patient reports that her daughter lives in [place], [place].  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  Patient/Family's Understanding of and Emotional Response to Diagnosis, Current Treatment, and Prognosis:  Patient states that she has no questions for SW at this time.  Emotional Assessment Appearance:  Appears stated age Attitude/Demeanor/Rapport:   (Appropriate.) Affect (typically observed):  Accepting, Appropriate Orientation:  Oriented to Self, Oriented to Place, Oriented to  Time, Oriented to Situation Alcohol / Substance use:  Not Applicable Psych involvement (Current and /or in the community):  No (Comment)  Discharge Needs  Concerns to be addressed:  Adjustment to Illness Readmission within the last 30 days:  No Current discharge risk:  None Barriers to Discharge:   (Patient is from [place]...)   Tilda Burrow R 12/11/2015, 12:11 PM

## 2015-12-11 NOTE — Progress Notes (Signed)
   Subjective:  Patient reports pain as mild to moderate.  No c/o.  Objective:   VITALS:   Vitals:   12/11/15 0933 12/11/15 1540 12/11/15 2004 12/11/15 2030  BP:  (!) 140/55 (!) 132/52   Pulse:  88 88   Resp:  19 18   Temp:   97.9 F (36.6 C)   TempSrc:   Oral   SpO2: 92% 95% 96% 91%  Weight:      Height:        ABD soft Sensation intact distally Dorsiflexion/Plantar flexion intact Incision: dressing C/D/I Compartment soft Foot warm and well perfused   Lab Results  Component Value Date   WBC 4.9 12/11/2015   HGB 9.2 (L) 12/11/2015   HCT 29.3 (L) 12/11/2015   MCV 92.1 12/11/2015   PLT 98 (L) 12/11/2015   BMET    Component Value Date/Time   NA 135 12/11/2015 0431   K 4.7 12/11/2015 0431   CL 104 12/11/2015 0431   CO2 23 12/11/2015 0431   GLUCOSE 149 (H) 12/11/2015 0431   BUN 35 (H) 12/11/2015 0431   CREATININE 2.35 (H) 12/11/2015 0431   CALCIUM 8.2 (L) 12/11/2015 0431   GFRNONAA 18 (L) 12/11/2015 0431   GFRAA 20 (L) 12/11/2015 0431     Assessment/Plan: 2 Days Post-Op   Principal Problem:   Right femoral fracture (HCC) Active Problems:   CAD in native artery   CKD stage G3a/A3, GFR 45 - 59 and albumin creatinine ratio >300 mg/g   Right femoral fracture, closed, initial encounter   Supracondylar fracture of femur (HCC)   NWB RLE PT/OT for bed --> chair xfers DVT ppx: lovenox x30 days, plavix PO pain control D/C planning, SNF placement   Jovanni Eckhart, Horald Pollen 12/11/2015, 9:29 PM   Rod Can, MD Cell [phone]

## 2015-12-11 NOTE — Progress Notes (Signed)
Physical Therapy Treatment Patient Details Name: [name] MRN: [id number] DOB: 1925/12/31 Today's Date: 12/11/2015    History of Present Illness 82 y.o. female who fell at home and sustained a distal Rt femur fx. Pt now s/p ORIF on 12/09/15 ORIF. PMH: Rt foot fx 10/2015, Rt TKA, HTN, diabetes, COPD. Pt lives in [place] and is here visiting.     PT Comments    Continue to recommend SNF for ongoing Physical Therapy and pt and family anticipating DC to SNF tomorrow. Will be limited greatly in mobility until she is able to [facility] on her RLE.   Follow Up Recommendations  SNF;Supervision for mobility/OOB     Equipment Recommendations   (to be addressed at next venue)    Recommendations for Other Services       Precautions / Restrictions Precautions Precautions: Fall Restrictions Weight Bearing Restrictions: Yes RLE Weight Bearing: Non weight bearing (per physician note) Other Position/Activity Restrictions: most recent progress note by Dr Delfino Lovett indicates NWB    Mobility  Bed Mobility Overal bed mobility: Needs Assistance;+2 for physical assistance Bed Mobility: Supine to Sit;Sit to Supine     Supine to sit: Mod assist;+2 for physical assistance Sit to supine: Mod assist;+2 for physical assistance   General bed mobility comments: Mod assist with Rt LE and trunk to come to sitting, pt able to assist with rail.   Transfers Overall transfer level: Needs assistance Equipment used: Rolling walker (2 wheeled) Transfers: Sit to/from Stand Sit to Stand: Mod assist;+2 physical assistance;+2 safety/equipment         General transfer comment: Able to stand twice for about 1 minute each time using RW.   Ambulation/Gait             General Gait Details: Unable to attempt gait or bed to chair tranfers today   Stairs            Wheelchair Mobility    Modified Rankin (Stroke Patients Only)       Balance   Sitting-balance support: Bilateral upper extremity  supported Sitting balance-Leahy Scale: Fair                              Cognition Arousal/Alertness: Awake/alert Behavior During Therapy: WFL for tasks assessed/performed Overall Cognitive Status: Within Functional Limits for tasks assessed                      Exercises Total Joint Exercises Ankle Circles/Pumps: AROM;Both;10 reps;Supine Hip ABduction/ADduction: AAROM;Right;5 reps;Supine Long Arc Quad: AAROM;Right;5 reps;Seated    General Comments General comments (skin integrity, edema, etc.): With pts permission daughter present for entire session. Educated that [facility] will be primary mode of mobility until WB status increased. Pt participated well.      Pertinent Vitals/Pain Pain Location: Pt did not rate pain on 0-10 pain but did indicate she had pain in her RLE Pain Intervention(s): Limited activity within patient's tolerance;Monitored during session;RN gave pain meds during session    Home Living                      Prior Function            PT Goals (current goals can now be found in the care plan section) Progress towards PT goals: Progressing toward goals    Frequency       PT Plan Current plan remains appropriate    Co-evaluation  End of Session Equipment Utilized During Treatment: Gait belt Activity Tolerance: Patient tolerated treatment well Patient left: in bed;with call bell/phone within reach;with SCD's reapplied;with family/visitor present;with nursing/sitter in room     Time: 1440-1516 PT Time Calculation (min) (ACUTE ONLY): 36 min  Charges:  $Gait Training: 8-22 mins $Therapeutic Exercise: 8-22 mins                    G Codes:      Melvern Banker 2016/01/02, 3:38 PM Lavonia Dana, [place] 01-02-2016

## 2015-12-11 NOTE — Progress Notes (Signed)
Orthopedic Tech Progress Note Patient Details:  [name] 05/28/1925 [id number]  Patient ID: [name], female   DOB: [birth year], 82 y.o.   MRN: [id number]   [name] 12/11/2015, 1:04 PM Pt unable to use trapeze bar patient helper; RN notified

## 2015-12-11 NOTE — Progress Notes (Signed)
[facility] requested additional information for pt regarding FL2, h &p. SW sent the requested information along with a 30 day note.  Tilda Burrow, Education officer, museum

## 2015-12-11 NOTE — NC FL2 (Signed)
[place] LEVEL OF CARE SCREENING TOOL     IDENTIFICATION  Patient Name: [name] Birthdate: 1926-02-18 Sex: female Admission Date (Current Location): 12/08/2015  [facility] and [place] Number:   (Patient is from [place] and Address:  [facility] [place], [place] [address], [place], [place] [address]      Provider Number: [id number]  Attending Physician Name and Address:  Elwin Mocha, MD  Relative Name and Phone Number:       Current Level of Care: Hospital Recommended Level of Care: [place] Prior Approval Number:    Date Approved/Denied:   PASRR Number:    Discharge Plan: SNF    Current Diagnoses: Patient Active Problem List   Diagnosis Date Noted  . Right femoral fracture ([place]) 12/09/2015  . CAD in native artery 12/09/2015  . CKD stage G3a/A3, GFR 45 - 59 and albumin creatinine ratio >300 mg/g 12/09/2015  . Right femoral fracture, closed, initial encounter 12/09/2015  . Supracondylar fracture of femur ([place]) 12/09/2015  . Diabetes mellitus without complication ([place])   . COPD (chronic obstructive pulmonary disease) ([place])   . Asthma   . Essential hypertension   . Asthma, mild intermittent     Orientation RESPIRATION BLADDER Height & Weight     Self, Time, Situation, Place  Normal Continent Weight: 175 lb (79.4 kg) Height:  5\' 4"  (162.6 cm)  BEHAVIORAL SYMPTOMS/MOOD NEUROLOGICAL BOWEL NUTRITION STATUS      Continent    AMBULATORY STATUS COMMUNICATION OF NEEDS Skin    (Patient states that she cannot put weight on her foot.) Verbally Normal                       Personal Care Assistance Level of Assistance  Bathing, Dressing Bathing Assistance: Maximum assistance   Dressing Assistance: Limited assistance     Functional Limitations Info             SPECIAL CARE FACTORS FREQUENCY                       Contractures      Additional Factors Info                 Current Medications (12/11/2015):  This is the current hospital active medication list Current Facility-Administered Medications  Medication Dose Route Frequency Provider Last Rate Last Dose  . 0.9 %  sodium chloride infusion   Intravenous Continuous Elwin Mocha, MD 100 mL/hr at 12/11/15 1202    . acetaminophen (TYLENOL) tablet 650 mg  650 mg Oral Q6H PRN Rod Can, MD   650 mg at 12/11/15 0813   Or  . acetaminophen (TYLENOL) suppository 650 mg  650 mg Rectal Q6H PRN Rod Can, MD      . albuterol (PROVENTIL) (2.5 MG/3ML) 0.083% nebulizer solution 2.5 mg  2.5 mg Nebulization Q4H PRN Thurnell Lose, MD      . albuterol (PROVENTIL) (2.5 MG/3ML) 0.083% nebulizer solution 3 mL  3 mL Inhalation Q4H PRN Thurnell Lose, MD      . arformoterol (BROVANA) nebulizer solution 15 mcg  15 mcg Nebulization Q12H Thurnell Lose, MD   15 mcg at 12/11/15 0931  . budesonide (PULMICORT) nebulizer solution 0.25 mg  0.25 mg Nebulization Daily Thurnell Lose, MD   0.25 mg at 12/11/15 0931  . clopidogrel (PLAVIX) tablet 75 mg  75 mg Oral Daily Rod Can, MD  75 mg at 12/11/15 0813  . docusate sodium (COLACE) capsule 100 mg  100 mg Oral BID Rod Can, MD   100 mg at 12/11/15 0813  . enoxaparin (LOVENOX) injection 30 mg  30 mg Subcutaneous Q24H Rod Can, MD   30 mg at 12/11/15 0813  . ferrous sulfate tablet 325 mg  325 mg Oral Q breakfast Thurnell Lose, MD   325 mg at 12/11/15 0813  . furosemide (LASIX) tablet 40 mg  40 mg Oral Daily Thurnell Lose, MD   40 mg at 12/11/15 [id number]  . hydrALAZINE (APRESOLINE) injection 10 mg  10 mg Intravenous Q6H PRN Thurnell Lose, MD      . HYDROcodone-acetaminophen (NORCO/VICODIN) 5-325 MG per tablet 1-2 tablet  1-2 tablet Oral Q6H PRN Rod Can, MD   2 tablet at 12/11/15 1030  . insulin aspart (novoLOG) injection 0-9 Units  0-9 Units Subcutaneous TID WC & HS Elwin Mocha, MD   2 Units at 12/11/15 1202  . labetalol (NORMODYNE) tablet  100 mg  100 mg Oral BID Thurnell Lose, MD   100 mg at 12/11/15 0823  . magnesium citrate solution 1 Bottle  1 Bottle Oral Once PRN Thurnell Lose, MD      . menthol-cetylpyridinium (CEPACOL) lozenge 3 mg  1 lozenge Oral PRN Rod Can, MD       Or  . phenol (CHLORASEPTIC) mouth spray 1 spray  1 spray Mouth/Throat PRN Rod Can, MD      . metoCLOPramide (REGLAN) tablet 5-10 mg  5-10 mg Oral Q8H PRN Rod Can, MD       Or  . metoCLOPramide (REGLAN) injection 5-10 mg  5-10 mg Intravenous Q8H PRN Rod Can, MD      . metoprolol (LOPRESSOR) injection 5 mg  5 mg Intravenous Q4H PRN Thurnell Lose, MD      . morphine 2 MG/ML injection 0.5 mg  0.5 mg Intravenous Q2H PRN Rod Can, MD   0.5 mg at 12/10/15 0157  . NIFEdipine (PROCARDIA-XL/ADALAT-CC/NIFEDICAL-XL) 24 hr tablet 30 mg  30 mg Oral BID Thurnell Lose, MD      . nitroGLYCERIN (NITROSTAT) SL tablet 0.4 mg  0.4 mg Sublingual Q5 min PRN Thurnell Lose, MD      . ondansetron [facility]) tablet 4 mg  4 mg Oral Q6H PRN Rod Can, MD       Or  . ondansetron [facility]) injection 4 mg  4 mg Intravenous Q6H PRN Rod Can, MD      . pravastatin (PRAVACHOL) tablet 20 mg  20 mg Oral q1800 Thurnell Lose, MD   20 mg at 12/10/15 1819  . prednisoLONE acetate (PRED FORTE) 1 % ophthalmic suspension 1 drop  1 drop Both Eyes BID Elwin Mocha, MD   1 drop at 12/10/15 2142  . senna (SENOKOT) tablet 8.6 mg  1 tablet Oral BID Rod Can, MD   8.6 mg at 12/11/15 [id number]     Discharge Medications: Please see discharge summary for a list of discharge medications.  Relevant Imaging Results:  Relevant Lab Results:   Additional Information    Venetia Maxon, [place] R

## 2015-12-12 ENCOUNTER — Inpatient Hospital Stay (HOSPITAL_COMMUNITY): Payer: Medicare Other

## 2015-12-12 LAB — RENAL FUNCTION PANEL
ALBUMIN: 2.3 g/dL — AB (ref 3.5–5.0)
ANION GAP: 7 (ref 5–15)
BUN: 51 mg/dL — AB (ref 6–20)
CALCIUM: 8.2 mg/dL — AB (ref 8.9–10.3)
CO2: 22 mmol/L (ref 22–32)
CREATININE: 2.61 mg/dL — AB (ref 0.44–1.00)
Chloride: 107 mmol/L (ref 101–111)
GFR calc Af Amer: 18 mL/min — ABNORMAL LOW (ref >60)
GFR calc non Af Amer: 15 mL/min — ABNORMAL LOW (ref >60)
GLUCOSE: 149 mg/dL — AB (ref 65–99)
PHOSPHORUS: 4.3 mg/dL (ref 2.5–4.6)
Potassium: 4.8 mmol/L (ref 3.5–5.1)
SODIUM: 136 mmol/L (ref 135–145)

## 2015-12-12 LAB — UREA NITROGEN, URINE: UREA NITROGEN UR: 329 mg/dL

## 2015-12-12 LAB — GLUCOSE, CAPILLARY
GLUCOSE-CAPILLARY: 132 mg/dL — AB (ref 65–99)
Glucose-Capillary: 154 mg/dL — ABNORMAL HIGH (ref 65–99)
Glucose-Capillary: 213 mg/dL — ABNORMAL HIGH (ref 65–99)
Glucose-Capillary: 167 mg/dL — ABNORMAL HIGH (ref 65–99)

## 2015-12-12 LAB — CREATININE, URINE, RANDOM: CREATININE, URINE: 51.02 mg/dL

## 2015-12-12 LAB — CBC
HCT: 26.2 % — ABNORMAL LOW (ref 36.0–46.0)
HEMOGLOBIN: 8.2 g/dL — AB (ref 12.0–15.0)
MCH: 29.1 pg (ref 26.0–34.0)
MCHC: 31.3 g/dL (ref 30.0–36.0)
MCV: 92.9 fL (ref 78.0–100.0)
PLATELETS: 103 10*3/uL — AB (ref 150–400)
RBC: 2.82 MIL/uL — ABNORMAL LOW (ref 3.87–5.11)
RDW: 14.7 % (ref 11.5–15.5)
WBC: 6.2 10*3/uL (ref 4.0–10.5)

## 2015-12-12 LAB — CK: Total CK: 253 U/L — ABNORMAL HIGH (ref 38–234)

## 2015-12-12 MED ORDER — SODIUM CHLORIDE 0.9 % IV SOLN
INTRAVENOUS | Status: AC
  Administered 2015-12-12 (×2): via INTRAVENOUS

## 2015-12-12 NOTE — Progress Notes (Signed)
Physical Therapy Treatment Patient Details Name: [name] MRN: [id number] DOB: [birth year] Today's Date: 12/12/2015    History of Present Illness 82 y.o. female who fell at home and sustained a distal Rt femur fx. Pt now s/p ORIF on 12/09/15 ORIF. PMH: Rt foot fx 10/2015, Rt TKA, HTN, diabetes, COPD. Pt lives in [place] and is here visiting.     PT Comments    Continue to recommend SNF for ongoing Physical Therapy.  Need to use [employer] for OOB.  Pt more fatigued and more painful today.    Follow Up Recommendations  SNF;Supervision for mobility/OOB     Equipment Recommendations   (to be addressed at next venue)    Recommendations for Other Services       Precautions / Restrictions Precautions Precautions: Fall Restrictions Weight Bearing Restrictions: Yes RLE Weight Bearing: Non weight bearing (per physician note) Other Position/Activity Restrictions: most recent progress note by Dr Delfino Lovett indicates NWB    Mobility  Bed Mobility Overal bed mobility: Needs Assistance;+2 for physical assistance Bed Mobility: Supine to Sit;Sit to Supine     Supine to sit: Mod assist;+2 for physical assistance Sit to supine: +2 for physical assistance;Max assist   General bed mobility comments: Pt requireds more assistance today than yesterday  Transfers Overall transfer level: Needs assistance Equipment used: Rolling walker (2 wheeled) Transfers: Sit to/from Stand Sit to Stand: +2 physical assistance;+2 safety/equipment;Max assist         General transfer comment: unable to achieve full standing. Pt declined to practice a second time.  Ambulation/Gait                 Stairs            Wheelchair Mobility    Modified Rankin (Stroke Patients Only)       Balance     Sitting balance-Leahy Scale: Poor                              Cognition Arousal/Alertness: Awake/alert (Although pt was not as alert as yesterday, less interactive) Behavior  During Therapy: WFL for tasks assessed/performed Overall Cognitive Status: Within Functional Limits for tasks assessed                      Exercises      General Comments General comments (skin integrity, edema, etc.): Pt was incontinent of urine in bed. Changed pad and gown and alerted nursing. Tech in to give bath at end of session. daughter present throughout. They anticipate DC to SNF tomorrow.       Pertinent Vitals/Pain Pain Assessment: 0-10 Pain Score: 8  Pain Location: RLE Pain Intervention(s): Limited activity within patient's tolerance;Monitored during session;Premedicated before session    Home Living                      Prior Function            PT Goals (current goals can now be found in the care plan section) Progress towards PT goals: Not progressing toward goals - comment (Pt more fatigued and with more pain today)    Frequency       PT Plan Current plan remains appropriate    Co-evaluation             End of Session Equipment Utilized During Treatment: Gait belt Activity Tolerance: Patient limited by fatigue;Patient limited by pain Patient left: in bed;with call  bell/phone within reach;with family/visitor present;with nursing/sitter in room     Time: 1052-1120 PT Time Calculation (min) (ACUTE ONLY): 28 min  Charges:  $Gait Training: 8-22 mins $Therapeutic Activity: 8-22 mins                    G Codes:      Melvern Banker 15-Dec-2015, 11:40 AM  Lavonia Dana, PT  [phone] December 15, 2015

## 2015-12-12 NOTE — Progress Notes (Signed)
Patient and family have accepted the bed offer from [facility].  SW spoke with Hoyle Sauer of [facility] who confirms that pt is welcomed to come to facility upon discharge.  Tilda Burrow, Social Worker [phone]

## 2015-12-12 NOTE — Progress Notes (Signed)
SW obtained PASRR number for patient from [facility].  PASRR: [id number]  Tilda Burrow, Social Work [phone]

## 2015-12-12 NOTE — Progress Notes (Signed)
[facility] PROGRESS NOTE  Kainoa Heinkel [id number] DOB: Aug 13, 1925 DOA: 12/08/2015 PCP: Pcp Not In System  Assessment/Plan:  1. Mechanical fall with closed right femoral neck fracture. Mgmt per ortho Cleared for discharge per orthopedics once medical issues resolved  2.CAD with stent placement in 2009. Chest pain-free, EKG nonacute, fairly active climbs one to 2 flights of stairs without any chest pain or shortness of breath, currently compensated.  Plavix restarted. Continue home dose beta blocker. PRN IV Lopressor and hydralazine for blood pressure control.  3. DM type II. Hold Lantus, 6.3 A1c, ACHS SSI  4. Essential hypertension. Home medications continued, IV Lopressor and hydralazine as needed.  5. AKI/CK D stage III. Last creatinine about 7 years ago is 1.3 in our system, currently creatinine is close to 2, this could be her new baseline. Cont gently hydrate and monitor for now. Thereafter outpatient follow-up with PCP in [place]. Urine labs ordered - fractional excretion of urea 43.3% Contact Dr. Joana Reamer MD [phone], tomorrow, pt had recent labs with Cr 1.5-1.7, no nephro visits or consults Holding lasix Checking renal U/S  6. Dyslipidemia. Continue home dose statin.    DVT Prophylaxis  SCDs   AM Labs Ordered, also please review Full Orders  Family Communication: Admission, patients condition and plan of care including tests being ordered have been discussed with the patient who indicates understanding and agree with the plan and Code Status.  Code Status full  Likely DC to  SNF maybe over weekend  Condition fair  Consults called: Orthopedics Dr. Veverly Fells  Admission status: Inpt    Time spent in minutes : 35  HPI/Subjective: Pain controlled. No N/V. Denies SOB. Asking for d/c.  Objective: Vitals:   12/11/15 2004 12/12/15 0525  BP: (!) 132/52 108/82  Pulse: 88 88  Resp: 18 18  Temp: 97.9 F (36.6 C) 97.3 F (36.3  C)    Intake/Output Summary (Last 24 hours) at 12/12/15 1548 Last data filed at 12/12/15 1500  Gross per 24 hour  Intake              360 ml  Output                0 ml  Net              360 ml   Filed Weights   12/09/15 1651  Weight: 79.4 kg (175 lb)    Exam:  General:  No diaphoresis, calm, nNAD Cardiovascular: Regular rate and rhythm no murmurs rubs or gallops Respiratory: Clear to auscultation bilaterally, normal breathing Abdomen: Nondistended bowel sounds normal nontender palpation Musculoskeletal: Moving all extremities, no deformity, 5 out of 5 strength; Dressing on RLE CDI, mild induration at surgical site, no erythema  Data Reviewed: Basic Metabolic Panel:  Recent Labs Lab 12/09/15 0402 12/10/15 0351 12/11/15 0431 12/12/15 0401  NA 137 138 135 136  K 4.7 4.8 4.7 4.8  CL 103 105 104 107  CO2 25 25 23 22   GLUCOSE 182* 157* 149* 149*  BUN 40* 33* 35* 51*  CREATININE 2.04* 2.14* 2.35* 2.61*  CALCIUM 8.8* 8.4* 8.2* 8.2*  PHOS  --   --   --  4.3   Liver Function Tests:  Recent Labs Lab 12/12/15 0401  ALBUMIN 2.3*   No results for input(s): LIPASE, AMYLASE in the last 168 hours. No results for input(s): AMMONIA in the last 168 hours. CBC:  Recent Labs Lab 12/09/15 0402 12/10/15 0351 12/11/15 0431 12/12/15 0400  WBC 8.9 5.0 4.9 6.2  NEUTROABS 7.3  --   --   --   HGB 11.8* 9.7* 9.2* 8.2*  HCT 36.3 31.3* 29.3* 26.2*  MCV 88.3 91.5 92.1 92.9  PLT 150 90* 98* 103*   Cardiac Enzymes:  Recent Labs Lab 12/12/15 0401  CKTOTAL 253*   BNP (last 3 results) No results for input(s): BNP in the last 8760 hours.  ProBNP (last 3 results) No results for input(s): PROBNP in the last 8760 hours.  CBG:  Recent Labs Lab 12/11/15 1143 12/11/15 1630 12/11/15 2118 12/12/15 0623 12/12/15 1143  GLUCAP 188* 206* 199* 154* 213*    Recent Results (from the past 240 hour(s))  Surgical PCR screen     Status: None   Collection Time: 12/09/15  4:04 PM   Result Value Ref Range Status   MRSA, PCR NEGATIVE NEGATIVE Final   Staphylococcus aureus NEGATIVE NEGATIVE Final    Comment:        The Xpert SA Assay (FDA approved for NASAL specimens in patients over 80 years of age), is one component of a comprehensive surveillance program.  Test performance has been validated by [facility] for patients greater than or equal to 80 year old. It is not intended to diagnose infection nor to guide or monitor treatment.      Studies: No results found.  Scheduled Meds: . arformoterol  15 mcg Nebulization Q12H  . budesonide  0.25 mg Nebulization Daily  . clopidogrel  75 mg Oral Daily  . docusate sodium  100 mg Oral BID  . enoxaparin (LOVENOX) injection  30 mg Subcutaneous Q24H  . ferrous sulfate  325 mg Oral Q breakfast  . insulin aspart  0-9 Units Subcutaneous TID WC & HS  . labetalol  100 mg Oral BID  . NIFEdipine  30 mg Oral BID  . pravastatin  20 mg Oral q1800  . prednisoLONE acetate  1 drop Both Eyes BID  . senna  1 tablet Oral BID   Continuous Infusions:    Principal Problem:   Right femoral fracture (HCC) Active Problems:   CAD in native artery   CKD stage G3a/A3, GFR 45 - 59 and albumin creatinine ratio >300 mg/g   Right femoral fracture, closed, initial encounter   Supracondylar fracture of femur ([place])    Time spent: [place] [facility]ists Pager [place]. If 7PM-7AM, please contact night-coverage at [email or web address], password [facility] 12/12/2015, 3:48 PM  LOS: 3 days

## 2015-12-12 NOTE — Progress Notes (Signed)
   Subjective:  Patient reports pain as mild to moderate.  No c/o.  Objective:   VITALS:   Vitals:   12/11/15 2004 12/11/15 2030 12/12/15 0525 12/12/15 0908  BP: (!) 132/52  108/82   Pulse: 88  88   Resp: 18  18   Temp: 97.9 F (36.6 C)  97.3 F (36.3 C)   TempSrc: Oral  Oral   SpO2: 96% 91% 100% 97%  Weight:      Height:        ABD soft Sensation intact distally Dorsiflexion/Plantar flexion intact Incision: dressing C/D/I Compartment soft Foot warm and well perfused   Lab Results  Component Value Date   WBC 6.2 12/12/2015   HGB 8.2 (L) 12/12/2015   HCT 26.2 (L) 12/12/2015   MCV 92.9 12/12/2015   PLT 103 (L) 12/12/2015   BMET    Component Value Date/Time   NA 136 12/12/2015 0401   K 4.8 12/12/2015 0401   CL 107 12/12/2015 0401   CO2 22 12/12/2015 0401   GLUCOSE 149 (H) 12/12/2015 0401   BUN 51 (H) 12/12/2015 0401   CREATININE 2.61 (H) 12/12/2015 0401   CALCIUM 8.2 (L) 12/12/2015 0401   GFRNONAA 15 (L) 12/12/2015 0401   GFRAA 18 (L) 12/12/2015 0401     Assessment/Plan: 3 Days Post-Op   Principal Problem:   Right femoral fracture (HCC) Active Problems:   CAD in native artery   CKD stage G3a/A3, GFR 45 - 59 and albumin creatinine ratio >300 mg/g   Right femoral fracture, closed, initial encounter   Supracondylar fracture of femur (HCC)   NWB RLE PT/OT for bed --> chair xfers DVT ppx: lovenox x30 days, plavix PO pain control D/C planning, SNF placement   Shaiann Mcmanamon, Horald Pollen 12/12/2015, 12:35 PM   Rod Can, MD Cell [phone]

## 2015-12-13 LAB — CBC WITH DIFFERENTIAL/PLATELET
BASOS ABS: 0 10*3/uL (ref 0.0–0.1)
Basophils Relative: 0 %
EOS ABS: 0.2 10*3/uL (ref 0.0–0.7)
EOS PCT: 4 %
HCT: 25.5 % — ABNORMAL LOW (ref 36.0–46.0)
Hemoglobin: 7.9 g/dL — ABNORMAL LOW (ref 12.0–15.0)
LYMPHS PCT: 20 %
Lymphs Abs: 0.8 10*3/uL (ref 0.7–4.0)
MCH: 28.9 pg (ref 26.0–34.0)
MCHC: 31 g/dL (ref 30.0–36.0)
MCV: 93.4 fL (ref 78.0–100.0)
Monocytes Absolute: 0.4 10*3/uL (ref 0.1–1.0)
Monocytes Relative: 9 %
Neutro Abs: 2.8 10*3/uL (ref 1.7–7.7)
Neutrophils Relative %: 67 %
PLATELETS: 123 10*3/uL — AB (ref 150–400)
RBC: 2.73 MIL/uL — AB (ref 3.87–5.11)
RDW: 14.9 % (ref 11.5–15.5)
WBC: 4.2 10*3/uL (ref 4.0–10.5)

## 2015-12-13 LAB — RENAL FUNCTION PANEL
Albumin: 2.2 g/dL — ABNORMAL LOW (ref 3.5–5.0)
Anion gap: 7 (ref 5–15)
BUN: 48 mg/dL — AB (ref 6–20)
CO2: 22 mmol/L (ref 22–32)
Calcium: 8.4 mg/dL — ABNORMAL LOW (ref 8.9–10.3)
Chloride: 109 mmol/L (ref 101–111)
Creatinine, Ser: 2.45 mg/dL — ABNORMAL HIGH (ref 0.44–1.00)
GFR calc Af Amer: 19 mL/min — ABNORMAL LOW (ref >60)
GFR calc non Af Amer: 17 mL/min — ABNORMAL LOW (ref >60)
Glucose, Bld: 217 mg/dL — ABNORMAL HIGH (ref 65–99)
PHOSPHORUS: 3.6 mg/dL (ref 2.5–4.6)
POTASSIUM: 4.3 mmol/L (ref 3.5–5.1)
SODIUM: 138 mmol/L (ref 135–145)

## 2015-12-13 LAB — GLUCOSE, CAPILLARY
GLUCOSE-CAPILLARY: 193 mg/dL — AB (ref 65–99)
GLUCOSE-CAPILLARY: 271 mg/dL — AB (ref 65–99)
GLUCOSE-CAPILLARY: 268 mg/dL — AB (ref 65–99)
Glucose-Capillary: 147 mg/dL — ABNORMAL HIGH (ref 65–99)

## 2015-12-13 LAB — PREPARE RBC (CROSSMATCH)

## 2015-12-13 MED ORDER — SODIUM CHLORIDE 0.9 % IV SOLN: Freq: Once | INTRAVENOUS | Status: DC

## 2015-12-13 NOTE — Progress Notes (Signed)
Orthopedics Progress Note  Subjective: Patient still complaining of pain in the right leg, but it is better.  Objective:  Vitals:   12/13/15 0536 12/13/15 0927  BP: (!) 124/43 (!) 115/58  Pulse: 77 66  Resp: 16   Temp: 98.6 F (37 C)     General: Awake and alert  Musculoskeletal: right leg dressing intact, minimal swelling and min pain with some gentle passive ROM. No problem with ankle pumps. Neurovascularly intact  Lab Results  Component Value Date   WBC 4.2 12/13/2015   HGB 7.9 (L) 12/13/2015   HCT 25.5 (L) 12/13/2015   MCV 93.4 12/13/2015   PLT 123 (L) 12/13/2015       Component Value Date/Time   NA 138 12/13/2015 1016   K 4.3 12/13/2015 1016   CL 109 12/13/2015 1016   CO2 22 12/13/2015 1016   GLUCOSE 217 (H) 12/13/2015 1016   BUN 48 (H) 12/13/2015 1016   CREATININE 2.45 (H) 12/13/2015 1016   CALCIUM 8.4 (L) 12/13/2015 1016   GFRNONAA 17 (L) 12/13/2015 1016   GFRAA 19 (L) 12/13/2015 1016    Lab Results  Component Value Date   INR 0.97 12/09/2015   INR 1.2 05/01/2008   INR 1.2 04/30/2008    Assessment/Plan: POD #2 s/p Procedure(s): ORIF OF DISTAL FEMUR Stable from ortho standpoint. Renal insufficiency - internal medicine managing Acute Blood Loss Anemia - Hgb less than 7, would recommend transfusion at this level but will defer to medicine service given kidney/fluid issues. Thank you!   Doran Heater. Veverly Fells, MD 12/13/2015 2:52 PM

## 2015-12-13 NOTE — Progress Notes (Signed)
[facility] PROGRESS NOTE  [name] [id number] DOB: Jan 24, 1926 DOA: 12/08/2015 PCP: Pcp Not In System  Assessment/Plan:  1. Mechanical fall with closed right femoral neck fracture. Mgmt per ortho Cleared for discharge per orthopedics once medical issues resolved  2.CAD with stent placement in 2009. Chest pain-free, EKG nonacute, fairly active climbs one to 2 flights of stairs without any chest pain or shortness of breath, currently compensated.  Plavix restarted. Continue home dose beta blocker. PRN IV Lopressor and hydralazine for blood pressure control.  3. DM type II. Hold Lantus, 6.3 A1c, ACHS SSI  4. Essential hypertension. Home medications continued, IV Lopressor and hydralazine as needed.  5. AKI/CK D stage III. Last creatinine about 7 years ago is 1.3 in our system, currently creatinine is close to 2, this could be her new baseline. Cont gently hydrate and monitor for now. Thereafter outpatient follow-up with PCP in [place]. Urine labs ordered - fractional excretion of urea 43.3% Contact Dr. Joana Reamer MD [phone], tomorrow, pt had recent labs with Cr 1.5-1.7, no nephro visits or consults Holding lasix Checking renal U/S-->medical renal disease  6. Dyslipidemia. Continue home dose statin.  7. Anemia - falling hgb - no CP or ACS but family concerned, typical post op issue - increases post op risk for new joint, ortho okay with transfusion - Transfuse 2U   DVT Prophylaxis  SCDs   AM Labs Ordered, also please review Full Orders  Family Communication: Admission, patients condition and plan of care including tests being ordered have been discussed with the patient who indicates understanding and agree with the plan and Code Status.  Code Status full  Likely DC to  SNF maybe tomorrow  Condition fair  Consults called: Orthopedics Dr. Veverly Fells  Admission status: Inpt    Time spent in minutes : 35  HPI/Subjective: Pain  controlled. No N/V. Denies SOB. Asking for d/c.  Objective: Vitals:   12/13/15 0927 12/13/15 1500  BP: (!) 115/58 118/60  Pulse: 66 74  Resp:  18  Temp:  98.2 F (36.8 C)    Intake/Output Summary (Last 24 hours) at 12/13/15 1551 Last data filed at 12/13/15 1500  Gross per 24 hour  Intake              720 ml  Output                0 ml  Net              720 ml   Filed Weights   12/09/15 1651  Weight: 79.4 kg (175 lb)    Exam:  General:  No diaphoresis, calm, NAD Cardiovascular: Regular rate and rhythm no murmurs rubs or gallops Respiratory: Clear to auscultation bilaterally, normal breathing Abdomen: Nondistended bowel sounds normal nontender palpation Musculoskeletal: Moving all extremities, no deformity, 5 out of 5 strength; Dressing on RLE CDI, mild induration at surgical site, no erythema  Data Reviewed: Basic Metabolic Panel:  Recent Labs Lab 12/09/15 0402 12/10/15 0351 12/11/15 0431 12/12/15 0401 12/13/15 1016  NA 137 138 135 136 138  K 4.7 4.8 4.7 4.8 4.3  CL 103 105 104 107 109  CO2 25 25 23 22 22   GLUCOSE 182* 157* 149* 149* 217*  BUN 40* 33* 35* 51* 48*  CREATININE 2.04* 2.14* 2.35* 2.61* 2.45*  CALCIUM 8.8* 8.4* 8.2* 8.2* 8.4*  PHOS  --   --   --  4.3 3.6   Liver Function Tests:  Recent Labs Lab 12/12/15  0401 12/13/15 1016  ALBUMIN 2.3* 2.2*   No results for input(s): LIPASE, AMYLASE in the last 168 hours. No results for input(s): AMMONIA in the last 168 hours. CBC:  Recent Labs Lab 12/09/15 0402 12/10/15 0351 12/11/15 0431 12/12/15 0400 12/13/15 1016  WBC 8.9 5.0 4.9 6.2 4.2  NEUTROABS 7.3  --   --   --  2.8  HGB 11.8* 9.7* 9.2* 8.2* 7.9*  HCT 36.3 31.3* 29.3* 26.2* 25.5*  MCV 88.3 91.5 92.1 92.9 93.4  PLT 150 90* 98* 103* 123*   Cardiac Enzymes:  Recent Labs Lab 12/12/15 0401  CKTOTAL 253*   BNP (last 3 results) No results for input(s): BNP in the last 8760 hours.  ProBNP (last 3 results) No results for input(s):  PROBNP in the last 8760 hours.  CBG:  Recent Labs Lab 12/12/15 1143 12/12/15 1826 12/12/15 2218 12/13/15 0654 12/13/15 1129  GLUCAP 213* 167* 132* 147* 193*    Recent Results (from the past 240 hour(s))  Surgical PCR screen     Status: None   Collection Time: 12/09/15  4:04 PM  Result Value Ref Range Status   MRSA, PCR NEGATIVE NEGATIVE Final   Staphylococcus aureus NEGATIVE NEGATIVE Final    Comment:        The Xpert SA Assay (FDA approved for NASAL specimens in patients over 31 years of age), is one component of a comprehensive surveillance program.  Test performance has been validated by [facility] for patients greater than or equal to 64 year old. It is not intended to diagnose infection nor to guide or monitor treatment.      Studies: US Renal  Result Date: 12/12/2015 CLINICAL DATA:  Acute on chronic renal failure, diabetes mellitus, COPD, asthma, hypertension, former smoker EXAM: RENAL / URINARY TRACT ULTRASOUND COMPLETE COMPARISON:  None FINDINGS: Right Kidney: Length: 10.6 cm. Cortical thinning. Increased cortical echogenicity. Two small cysts identified, mid kidney 12 x 13 x 15 mm and peripelvic 23 x 21 x 18 mm. No definite solid mass or hydronephrosis. No shadowing calcifications. Left Kidney: Length: 9.3 cm. Cortical thinning. Increased cortical echogenicity. Tiny cyst upper pole 12 x 10 x 12 mm, minimally complicated with few internal echoes. Bladder: Well distended, normal appearance IMPRESSION: Medical renal disease changes of both kidneys. BILATERAL small renal cysts, with a minimally complicated 12 mm cyst upper pole LEFT kidney. Electronically Signed   By: Lavonia Dana M.D.   On: 12/12/2015 18:14    Scheduled Meds: . sodium chloride   Intravenous Once  . arformoterol  15 mcg Nebulization Q12H  . budesonide  0.25 mg Nebulization Daily  . clopidogrel  75 mg Oral Daily  . docusate sodium  100 mg Oral BID  . enoxaparin (LOVENOX) injection  30 mg  Subcutaneous Q24H  . ferrous sulfate  325 mg Oral Q breakfast  . insulin aspart  0-9 Units Subcutaneous TID WC & HS  . labetalol  100 mg Oral BID  . NIFEdipine  30 mg Oral BID  . pravastatin  20 mg Oral q1800  . prednisoLONE acetate  1 drop Both Eyes BID  . senna  1 tablet Oral BID   Continuous Infusions:    Principal Problem:   Right femoral fracture (HCC) Active Problems:   CAD in native artery   CKD stage G3a/A3, GFR 45 - 59 and albumin creatinine ratio >300 mg/g   Right femoral fracture, closed, initial encounter   Supracondylar fracture of femur ([place])    Time spent: 35  Elwin Mocha  [facility] Pager [place]. If 7PM-7AM, please contact night-coverage at [email or web address], password [facility] 12/13/2015, 3:51 PM  LOS: 4 days

## 2015-12-14 ENCOUNTER — Inpatient Hospital Stay (HOSPITAL_COMMUNITY): Payer: Medicare Other

## 2015-12-14 DIAGNOSIS — J810 Acute pulmonary edema: Secondary | ICD-10-CM

## 2015-12-14 LAB — BASIC METABOLIC PANEL
Anion gap: 6 (ref 5–15)
BUN: 45 mg/dL — AB (ref 6–20)
CALCIUM: 8.6 mg/dL — AB (ref 8.9–10.3)
CO2: 22 mmol/L (ref 22–32)
CREATININE: 2.35 mg/dL — AB (ref 0.44–1.00)
Chloride: 111 mmol/L (ref 101–111)
GFR calc non Af Amer: 18 mL/min — ABNORMAL LOW (ref >60)
GFR, EST AFRICAN AMERICAN: 20 mL/min — AB (ref >60)
Glucose, Bld: 180 mg/dL — ABNORMAL HIGH (ref 65–99)
Potassium: 4.1 mmol/L (ref 3.5–5.1)
SODIUM: 139 mmol/L (ref 135–145)

## 2015-12-14 LAB — GLUCOSE, CAPILLARY
GLUCOSE-CAPILLARY: 185 mg/dL — AB (ref 65–99)
Glucose-Capillary: 167 mg/dL — ABNORMAL HIGH (ref 65–99)
Glucose-Capillary: 175 mg/dL — ABNORMAL HIGH (ref 65–99)
Glucose-Capillary: 165 mg/dL — ABNORMAL HIGH (ref 65–99)

## 2015-12-14 LAB — CBC
HCT: 31.3 % — ABNORMAL LOW (ref 36.0–46.0)
Hemoglobin: 9.9 g/dL — ABNORMAL LOW (ref 12.0–15.0)
MCH: 28.8 pg (ref 26.0–34.0)
MCHC: 31.6 g/dL (ref 30.0–36.0)
MCV: 91 fL (ref 78.0–100.0)
PLATELETS: 148 10*3/uL — AB (ref 150–400)
RBC: 3.44 MIL/uL — AB (ref 3.87–5.11)
RDW: 14.9 % (ref 11.5–15.5)
WBC: 5.1 10*3/uL (ref 4.0–10.5)

## 2015-12-14 LAB — BRAIN NATRIURETIC PEPTIDE: B NATRIURETIC PEPTIDE 5: 1458.3 pg/mL — AB (ref 0.0–100.0)

## 2015-12-14 LAB — TROPONIN I
Troponin I: 0.16 ng/mL (ref <0.03)
Troponin I: 0.18 ng/mL (ref <0.03)

## 2015-12-14 MED ORDER — CETYLPYRIDINIUM CHLORIDE 0.05 % MT LIQD
7.0000 mL | Freq: Two times a day (BID) | OROMUCOSAL | Status: DC
  Administered 2015-12-14 – 2015-12-17 (×5): 7 mL via OROMUCOSAL

## 2015-12-14 MED ORDER — FUROSEMIDE 10 MG/ML IJ SOLN: 80.0000 mg | Freq: Three times a day (TID) | INTRAMUSCULAR | Status: DC

## 2015-12-14 MED ORDER — ASPIRIN 325 MG PO TABS
325.0000 mg | ORAL_TABLET | Freq: Once | ORAL | Status: AC
  Administered 2015-12-14: 325 mg via ORAL
  Filled 2015-12-14: qty 1

## 2015-12-14 MED ORDER — FUROSEMIDE 10 MG/ML IJ SOLN
60.0000 mg | Freq: Three times a day (TID) | INTRAMUSCULAR | Status: DC
  Administered 2015-12-14 – 2015-12-16 (×6): 60 mg via INTRAVENOUS
  Filled 2015-12-14 (×8): qty 6

## 2015-12-14 MED ORDER — FUROSEMIDE 10 MG/ML IJ SOLN
40.0000 mg | Freq: Once | INTRAMUSCULAR | Status: AC
  Administered 2015-12-14: 40 mg via INTRAVENOUS
  Filled 2015-12-14: qty 4

## 2015-12-14 NOTE — Progress Notes (Signed)
Orthopedics Progress Note  Subjective: Patient still in a fair amount of pain and has difficulty moving  Objective:  Vitals:   12/14/15 0252 12/14/15 0532  BP: (!) 158/62 (!) 112/44  Pulse: 78 73  Resp: 18 16  Temp: 98.2 F (36.8 C) 97.9 F (36.6 C)    General: Awake and alert  Musculoskeletal: right hip dressing clean and dry and intact, moderate swelling, patient able to do an ankle pump with min pain Neurovascularly intact  Lab Results  Component Value Date   WBC 4.2 12/13/2015   HGB 7.9 (L) 12/13/2015   HCT 25.5 (L) 12/13/2015   MCV 93.4 12/13/2015   PLT 123 (L) 12/13/2015       Component Value Date/Time   NA 138 12/13/2015 1016   K 4.3 12/13/2015 1016   CL 109 12/13/2015 1016   CO2 22 12/13/2015 1016   GLUCOSE 217 (H) 12/13/2015 1016   BUN 48 (H) 12/13/2015 1016   CREATININE 2.45 (H) 12/13/2015 1016   CALCIUM 8.4 (L) 12/13/2015 1016   GFRNONAA 17 (L) 12/13/2015 1016   GFRAA 19 (L) 12/13/2015 1016    Lab Results  Component Value Date   INR 0.97 12/09/2015   INR 1.2 05/01/2008   INR 1.2 04/30/2008    Assessment/Plan: POD #3 s/p Procedure(s): ORIF OF DISTAL FEMUR Acute blood loss anemia - consider transfusion Continue mobilization and therapy Will need SNF placement  Alpha R. Veverly Fells, MD 12/14/2015 7:52 AM

## 2015-12-14 NOTE — Progress Notes (Signed)
Patient to transfer to 3S04.  Report called.  Family present (grandson) and made aware.  Daughter also made aware by telephone.  All belongings with family at this time.

## 2015-12-14 NOTE — Progress Notes (Signed)
CRITICAL VALUE ALERT  Critical value received:  Trop 0.16  Date of notification:  12/14/15  Time of notification:  1612  Critical value read back:Yes.    Nurse who received alert:  Unit CN  MD notified (1st page):  Dr. Aggie Moats  Time of first page:  1623  MD notified (2nd page):  Time of second page:  Responding MD:  Dr. Aggie Moats  Time MD responded:  [phone]

## 2015-12-14 NOTE — Progress Notes (Signed)
Scheduled neb not given, PRN neb will given if needed. Patient refuse at this time and wants to eat her dinner

## 2015-12-14 NOTE — Progress Notes (Signed)
Admission note:   Arrival Method: Transfer from 5N for respiratory distress. Mental Status: A&Ox4. Telemetry: Placed on 3s04.   Skin: MASD to buttocks, right worse than left. Incision to right leg, ecchymotic areas to bilateral arms, hips.  Tubes: 2L O2 n/c.  IV: Arrived with RW PIV that was painful, now has a LFA NSL.  Pain: Medicated with prn's.   Family: At bedside. Living Situation: From home.  Safety Measures: Instructed to call for assistance, bed alarm in place, call bell within reach.  3S Orientation: Oriented to unit and surroundings. Family updated at bedside. ELink informed of patient's arrival.   Joellen Jersey, RN.

## 2015-12-14 NOTE — Progress Notes (Signed)
Orthopedic Tech Progress Note Patient Details:  [name] 1926/04/30 [id number]  Ortho Devices Ortho Device/Splint Location: Trapeze bar Ortho Device/Splint Interventions: Application   [name] 12/14/2015, 11:53 AM

## 2015-12-14 NOTE — Consult Note (Signed)
Renal Service Consult Note [facility]  Irania Singleton 12/14/2015 [place] Requesting Physician:  Dr. Aggie Moats  Reason for Consult:  Acute on CRF HPI: The patient is a 82 y.o. year-old with hx of COPD, DM on insulin,  HL, HTN, cancer and asthma. She has had a TKR on the right.  She fell on 8/8 and fractured the right femur proximal to the TKR prosthetic joint. Went to OR same day and the fracture was stabilized with a plate and screws.  Post op creat has gone up from 2. 0 > 2.4 and now there is pulm edema on CXR. She is about 8kg up from admission if wt's are accurate.  She has no c/o at this time.  No prod cough, chills, fever or sweats.  No chest pain. Asked to see for a/chron renal failure w vol overload.   Home meds > arimidex, pulmicort, plavix, iron, formoterol, lasix, lantus, labetalol, mevacor, mobic, procardia-XL, KCL, saxagliptin, tramadol, ventolin  Hosp admits > only admit here Dec 2009 with COPD exacerbation/ mild CHF, a/c renal  ROS  denies CP  no joint pain   no HA  no blurry vision  no rash  no diarrhea  no nausea/ vomiting  no dysuria  no difficulty voiding  no change in urine color    Past Medical History  Past Medical History:  Diagnosis Date  . Arthritis   . Asthma   . Cancer [facility])    breast - left  . COPD (chronic obstructive pulmonary disease) ([place])   . Diabetes mellitus without complication ([place])    Type II  . Hypertension    Past Surgical History  Past Surgical History:  Procedure Laterality Date  . ABDOMINAL HYSTERECTOMY  1975  . APPENDECTOMY  1947  . BREAST LUMPECTOMY Left 2015  . FEMORAL ARTERY STENT     x2  . REPLACEMENT TOTAL KNEE Right   . STENT PLACEMENT VASCULAR ([place] HX)     2 in chest  . TONSILLECTOMY  1934  . TOTAL KNEE ARTHROPLASTY Right 12/09/2015   Procedure: ORIF OF DISTAL FEMUR;  Surgeon: Rod Can, MD;  Location: [facility];  Service: Orthopedics;  Laterality: Right;   Family History History reviewed. No  pertinent family history. Social History  reports that she quit smoking about 17 years ago. Her smoking use included Cigarettes. She has never used smokeless tobacco. She reports that she does not drink alcohol or use drugs. Allergies  Allergies  Allergen Reactions  . Sulfa Antibiotics Other (See Comments)    Reaction unknown  . Tape Other (See Comments)    Blisters  . Penicillins Rash    Has patient had a PCN reaction causing immediate rash, facial/tongue/throat swelling, SOB or lightheadedness with hypotension: yes Has patient had a PCN reaction causing severe rash involving mucus membranes or skin necrosis: no Has patient had a PCN reaction that required hospitalization yes as a teenager Has patient had a PCN reaction occurring within the last 10 years: no was a teenager If all of the above answers are "NO", then may proceed with Cephalosporin use.    Home medications Prior to Admission medications   Medication Sig Start Date End Date Taking? Authorizing Provider  anastrozole (ARIMIDEX) 1 MG tablet Take 1 mg by mouth daily.    Yes Historical Provider, MD  b complex vitamins tablet Take 1 tablet by mouth daily.   Yes Historical Provider, MD  budesonide (PULMICORT) 0.25 MG/2ML nebulizer solution Take 0.25 mg by nebulization daily.  Yes Historical Provider, MD  clopidogrel (PLAVIX) 75 MG tablet Take 75 mg by mouth daily.   Yes Historical Provider, MD  Ferrous Sulfate (IRON) 325 (65 Fe) MG TABS Take 325 mg by mouth daily.   Yes Historical Provider, MD  formoterol (PERFOROMIST) 20 MCG/2ML nebulizer solution Take 20 mcg by nebulization daily.    Yes Historical Provider, MD  furosemide (LASIX) 40 MG tablet Take 40 mg by mouth daily.    Yes Historical Provider, MD  Garlic [id number] MG CAPS Take 1,000 mg by mouth daily.   Yes Historical Provider, MD  Insulin Glargine (TOUJEO SOLOSTAR) 300 UNIT/ML SOPN Inject 10 Units into the skin daily.   Yes Historical Provider, MD  labetalol (NORMODYNE) 100 MG  tablet Take 100 mg by mouth 2 (two) times daily.   Yes Historical Provider, MD  lovastatin (MEVACOR) 20 MG tablet Take 20 mg by mouth daily.    Yes Historical Provider, MD  meloxicam (MOBIC) 7.5 MG tablet Take 7.5 mg by mouth daily as needed for pain.  10/14/15  Yes Historical Provider, MD  Multiple Vitamins-Minerals (OCUTABS) TABS Take 1 tablet by mouth daily.   Yes Historical Provider, MD  NIFEdipine (PROCARDIA-XL/ADALAT CC) 30 MG 24 hr tablet Take 30 mg by mouth 2 (two) times daily. 09/05/15  Yes Historical Provider, MD  nitroGLYCERIN (NITROSTAT) 0.4 MG SL tablet Place 0.4 mg under the tongue every 5 (five) minutes as needed for chest pain.   Yes Historical Provider, MD  Omega-3 Fatty Acids (OMEGA-3 PLUS PO) Take 1 capsule by mouth 2 (two) times daily.    Yes Historical Provider, MD  Polyvinyl Alcohol-Povidone (REFRESH OP) Place 1 drop into both eyes 2 (two) times daily.   Yes Historical Provider, MD  potassium chloride SA (K-DUR,KLOR-CON) 20 MEQ tablet Take 20 mEq by mouth daily.   Yes Historical Provider, MD  prednisoLONE acetate (PRED FORTE) 1 % ophthalmic suspension 3 drops 2 (two) times daily. 11/26/15  Yes Historical Provider, MD  saxagliptin HCl (ONGLYZA) 2.5 MG TABS tablet Take 2.5 mg by mouth daily.   Yes Historical Provider, MD  traMADol (ULTRAM) 50 MG tablet Take 50 mg by mouth every 6 (six) hours as needed for moderate pain.   Yes Historical Provider, MD  VENTOLIN HFA 108 (90 Base) MCG/ACT inhaler Inhale 2 puffs into the lungs every 4 (four) hours as needed for wheezing or shortness of breath.  09/18/15  Yes Historical Provider, MD   Liver Function Tests  Recent Labs Lab 12/12/15 0401 12/13/15 1016  ALBUMIN 2.3* 2.2*   No results for input(s): LIPASE, AMYLASE in the last 168 hours. CBC  Recent Labs Lab 12/09/15 0402  12/12/15 0400 12/13/15 1016 12/14/15 1118  WBC 8.9  < > 6.2 4.2 5.1  NEUTROABS 7.3  --   --  2.8  --   HGB 11.8*  < > 8.2* 7.9* 9.9*  HCT 36.3  < > 26.2*  25.5* 31.3*  MCV 88.3  < > 92.9 93.4 91.0  PLT 150  < > 103* 123* 148*  < > = values in this interval not displayed. Basic Metabolic Panel  Recent Labs Lab 12/09/15 0402 12/10/15 0351 12/11/15 0431 12/12/15 0401 12/13/15 1016 12/14/15 1118  NA 137 138 135 136 138 139  K 4.7 4.8 4.7 4.8 4.3 4.1  CL 103 105 104 107 109 111  CO2 25 25 23 22 22 22   GLUCOSE 182* 157* 149* 149* 217* 180*  BUN 40* 33* 35* 51* 48* 45*  CREATININE 2.04*  2.14* 2.35* 2.61* 2.45* 2.35*  CALCIUM 8.8* 8.4* 8.2* 8.2* 8.4* 8.6*  PHOS  --   --   --  4.3 3.6  --    Iron/TIBC/Ferritin/ %Sat No results found for: IRON, TIBC, FERRITIN, IRONPCTSAT  Vitals:   12/14/15 0822 12/14/15 0948 12/14/15 1351 12/14/15 1610  BP:  (!) 119/43 (!) 146/54   Pulse:  71 70   Resp:  17 18   Temp:  98.5 F (36.9 C) 98.4 F (36.9 C)   TempSrc:  Oral Oral   SpO2: 96% 92% 96%   Weight:    88.2 kg (194 lb 7.1 oz)  Height:    5\' 4"  (1.626 m)   Exam Gen elderly frail women, in pain, moaning, alert No rash, cyanosis or gangrene Sclera anicteric, throat clear  No jvd or bruits Chest clear bilat, no rales or wheezing RRR no MRG Abd soft ntnd no mass or ascites +bs GU defer MS no joint effusions or deformity  Ext trace LE and 1+ UE edema / no wounds or ulcers R hip dressing in place Neuro is alert, nonfocal   Na 139  K 4.1  CO2 22  BUN 45  Cr 2.35  Glu 180  BNP 1450   WBC 5  Hb 9.9 plt 148 UA 8/8 > negative Renal US 8/11 > 9.3/ 10.6 cm kidneys, no hydro, cortical thinning CXR 8/13 > +pulm edema, vasc congestion   Assessment: 1  SOB/ pulm edema - by CXR. Consistent w/ wt gain from IVF's while here. Would start IV lasix and diurese here. 2  CKD stage 4 - baseline creat prob 2- 2.5 3  R femur fracture sp plate/ screw ORIF 4  Hx R TKR 5  COPD on home O2 6  HTN cont labet/ procardia for now  Plan - IV lasix  Kelly Splinter MD [place] pager [phone]    cell [phone] 12/14/2015, 4:44 PM

## 2015-12-14 NOTE — Progress Notes (Signed)
[facility] PROGRESS NOTE  [name] [id number] DOB: May 08, 1925 DOA: 12/08/2015 PCP: Pcp Not In System  Assessment/Plan:  Mechanical fall with closed right femoral neck fracture. Mgmt per ortho Cleared for discharge per orthopedics once medical issues resolved  Pulmonary edema Seen on CXR Patient with no history of congestive heart failure however she is on Lasix daily Patient states Lasix only for fluid in her legs BiPAP when necessary order Transfer patient to stepdown unit Troponin and BNP ordered EKG ordered Echo ordered  CAD with stent placement in 2009. Chest pain-free, EKG nonacute, fairly active climbs one to 2 flights of stairs without any chest pain or shortness of breath, currently compensated.  Plavix restarted. Continue home dose beta blocker. PRN IV Lopressor and hydralazine for blood pressure control.  DM type II. Hold Lantus, 6.3 A1c, ACHS SSI  Essential hypertension. Home medications continued, IV Lopressor and hydralazine as needed.  AKI/CK D stage III. Last creatinine about 7 years ago is 1.3 in our system, currently creatinine is close to 2, this could be her new baseline. Also had gentle hydration. Urine labs ordered - fractional excretion of urea 43.3% Contact Dr. Joana Reamer MD [phone], tomorrow, pt had recent labs with Cr 1.5-1.7, no nephro visits or consults Holding lasix initially but then patient developed dyspnea Checking renal U/S-->medical renal disease Nephrology consultation ordered  Dyslipidemia. Continue home dose statin.  Anemia - falling hgb improved - no CP or ACS but family concerned, typical post op issue - increases post op risk for new joint, ortho okay with transfusion - Transfused 2U 12/13/15   DVT Prophylaxis  SCDs   AM Labs Ordered, also please review Full Orders   Code Status full  Likely DC to  SNF maybe tomorrow  Condition fair  Consults called: Orthopedics Dr.  Veverly Fells  Admission status: Inpt    Time spent in minutes : 35  HPI/Subjective: Pain controlled. No N/V. Denies SOB. Asking for d/c.  Objective: Vitals:   12/14/15 0948 12/14/15 1351  BP: (!) 119/43 (!) 146/54  Pulse: 71 70  Resp: 17 18  Temp: 98.5 F (36.9 C) 98.4 F (36.9 C)    Intake/Output Summary (Last 24 hours) at 12/14/15 1425 Last data filed at 12/14/15 0252  Gross per 24 hour  Intake              685 ml  Output                0 ml  Net              685 ml   Filed Weights   12/09/15 1651  Weight: 79.4 kg (175 lb)    Exam:  General:  No diaphoresis, calm, mild resp distress Cardiovascular: Regular rate and rhythm no murmurs rubs or gallops Respiratory: Incr wob, speaking in short sentences Abdomen: Nondistended bowel sounds normal nontender palpation Musculoskeletal: Moving all extremities, no deformity, 5 out of 5 strength; Dressing on RLE CDI, mild induration at surgical site, no erythema  Data Reviewed: Basic Metabolic Panel:  Recent Labs Lab 12/10/15 0351 12/11/15 0431 12/12/15 0401 12/13/15 1016 12/14/15 1118  NA 138 135 136 138 139  K 4.8 4.7 4.8 4.3 4.1  CL 105 104 107 109 111  CO2 25 23 22 22 22   GLUCOSE 157* 149* 149* 217* 180*  BUN 33* 35* 51* 48* 45*  CREATININE 2.14* 2.35* 2.61* 2.45* 2.35*  CALCIUM 8.4* 8.2* 8.2* 8.4* 8.6*  PHOS  --   --  4.3 3.6  --    Liver Function Tests:  Recent Labs Lab 12/12/15 0401 12/13/15 1016  ALBUMIN 2.3* 2.2*   No results for input(s): LIPASE, AMYLASE in the last 168 hours. No results for input(s): AMMONIA in the last 168 hours. CBC:  Recent Labs Lab 12/09/15 0402 12/10/15 0351 12/11/15 0431 12/12/15 0400 12/13/15 1016 12/14/15 1118  WBC 8.9 5.0 4.9 6.2 4.2 5.1  NEUTROABS 7.3  --   --   --  2.8  --   HGB 11.8* 9.7* 9.2* 8.2* 7.9* 9.9*  HCT 36.3 31.3* 29.3* 26.2* 25.5* 31.3*  MCV 88.3 91.5 92.1 92.9 93.4 91.0  PLT 150 90* 98* 103* 123* 148*   Cardiac Enzymes:  Recent Labs Lab  12/12/15 0401  CKTOTAL 253*   BNP (last 3 results) No results for input(s): BNP in the last 8760 hours.  ProBNP (last 3 results) No results for input(s): PROBNP in the last 8760 hours.  CBG:  Recent Labs Lab 12/13/15 1129 12/13/15 1630 12/13/15 2203 12/14/15 0648 12/14/15 1109  GLUCAP 193* 271* 268* 167* 185*    Recent Results (from the past 240 hour(s))  Surgical PCR screen     Status: None   Collection Time: 12/09/15  4:04 PM  Result Value Ref Range Status   MRSA, PCR NEGATIVE NEGATIVE Final   Staphylococcus aureus NEGATIVE NEGATIVE Final    Comment:        The Xpert SA Assay (FDA approved for NASAL specimens in patients over 35 years of age), is one component of a comprehensive surveillance program.  Test performance has been validated by [facility] for patients greater than or equal to 3 year old. It is not intended to diagnose infection nor to guide or monitor treatment.      Studies: US Renal  Result Date: 12/12/2015 CLINICAL DATA:  Acute on chronic renal failure, diabetes mellitus, COPD, asthma, hypertension, former smoker EXAM: RENAL / URINARY TRACT ULTRASOUND COMPLETE COMPARISON:  None FINDINGS: Right Kidney: Length: 10.6 cm. Cortical thinning. Increased cortical echogenicity. Two small cysts identified, mid kidney 12 x 13 x 15 mm and peripelvic 23 x 21 x 18 mm. No definite solid mass or hydronephrosis. No shadowing calcifications. Left Kidney: Length: 9.3 cm. Cortical thinning. Increased cortical echogenicity. Tiny cyst upper pole 12 x 10 x 12 mm, minimally complicated with few internal echoes. Bladder: Well distended, normal appearance IMPRESSION: Medical renal disease changes of both kidneys. BILATERAL small renal cysts, with a minimally complicated 12 mm cyst upper pole LEFT kidney. Electronically Signed   By: Lavonia Dana M.D.   On: 12/12/2015 18:14   Dg Chest Port 1 View  Result Date: 12/14/2015 CLINICAL DATA:  Tachypnea EXAM: PORTABLE CHEST 1 VIEW  COMPARISON:  12/09/2015 chest radiograph. FINDINGS: Stable cardiomediastinal silhouette with mild cardiomegaly and aortic atherosclerosis. No pneumothorax. Probable trace bilateral pleural effusions. Mild pulmonary edema. Mild bibasilar atelectasis. IMPRESSION: 1. Mild congestive heart failure. 2. Probable trace bilateral pleural effusions and mild bibasilar atelectasis. 3. Aortic atherosclerosis. Electronically Signed   By: Ilona Sorrel M.D.   On: 12/14/2015 12:30   Dg Abd Portable 1v  Result Date: 12/14/2015 CLINICAL DATA:  Constipation. EXAM: PORTABLE ABDOMEN - 1 VIEW COMPARISON:  None. FINDINGS: Normal bowel gas pattern. There is mild increased stool in the rectum. Right iliac artery vascular stent is noted. There are clips in the outer quadrant by prior cholecystectomy. Aortoiliac vascular calcifications are noted. Soft tissues are otherwise unremarkable. Bony structures are demineralized but grossly intact. IMPRESSION: 1.  No acute findings.  No evidence of bowel obstruction. 2. Mild increased stool noted in the rectum. Electronically Signed   By: Lajean Manes M.D.   On: 12/14/2015 12:27    Scheduled Meds: . sodium chloride   Intravenous Once  . arformoterol  15 mcg Nebulization Q12H  . budesonide  0.25 mg Nebulization Daily  . clopidogrel  75 mg Oral Daily  . enoxaparin (LOVENOX) injection  30 mg Subcutaneous Q24H  . furosemide  40 mg Intravenous Once  . insulin aspart  0-9 Units Subcutaneous TID WC & HS  . labetalol  100 mg Oral BID  . NIFEdipine  30 mg Oral BID  . pravastatin  20 mg Oral q1800  . prednisoLONE acetate  1 drop Both Eyes BID   Continuous Infusions:    Principal Problem:   Right femoral fracture (HCC) Active Problems:   CAD in native artery   CKD stage G3a/A3, GFR 45 - 59 and albumin creatinine ratio >300 mg/g   Right femoral fracture, closed, initial encounter   Supracondylar fracture of femur ([place])    Time spent: [place]  Hospitalists Pager [place]. If 7PM-7AM, please contact night-coverage at [email or web address], password [facility] 12/14/2015, 2:25 PM  LOS: 5 days

## 2015-12-15 ENCOUNTER — Other Ambulatory Visit (HOSPITAL_COMMUNITY): Payer: Medicare Other

## 2015-12-15 LAB — CBC WITH DIFFERENTIAL/PLATELET
BASOS ABS: 0 10*3/uL (ref 0.0–0.1)
Basophils Relative: 0 %
EOS PCT: 4 %
Eosinophils Absolute: 0.2 10*3/uL (ref 0.0–0.7)
HEMATOCRIT: 30.3 % — AB (ref 36.0–46.0)
HEMOGLOBIN: 9.5 g/dL — AB (ref 12.0–15.0)
LYMPHS PCT: 25 %
Lymphs Abs: 1.2 10*3/uL (ref 0.7–4.0)
MCH: 28.4 pg (ref 26.0–34.0)
MCHC: 31.4 g/dL (ref 30.0–36.0)
MCV: 90.7 fL (ref 78.0–100.0)
Monocytes Absolute: 0.5 10*3/uL (ref 0.1–1.0)
Monocytes Relative: 11 %
NEUTROS ABS: 2.9 10*3/uL (ref 1.7–7.7)
NEUTROS PCT: 60 %
PLATELETS: 169 10*3/uL (ref 150–400)
RBC: 3.34 MIL/uL — AB (ref 3.87–5.11)
RDW: 15.2 % (ref 11.5–15.5)
WBC: 4.8 10*3/uL (ref 4.0–10.5)

## 2015-12-15 LAB — GLUCOSE, CAPILLARY
GLUCOSE-CAPILLARY: 241 mg/dL — AB (ref 65–99)
GLUCOSE-CAPILLARY: 305 mg/dL — AB (ref 65–99)
GLUCOSE-CAPILLARY: 188 mg/dL — AB (ref 65–99)
Glucose-Capillary: 169 mg/dL — ABNORMAL HIGH (ref 65–99)
Glucose-Capillary: 177 mg/dL — ABNORMAL HIGH (ref 65–99)

## 2015-12-15 LAB — BASIC METABOLIC PANEL
ANION GAP: 6 (ref 5–15)
BUN: 48 mg/dL — ABNORMAL HIGH (ref 6–20)
CO2: 24 mmol/L (ref 22–32)
Calcium: 9 mg/dL (ref 8.9–10.3)
Chloride: 109 mmol/L (ref 101–111)
Creatinine, Ser: 2.48 mg/dL — ABNORMAL HIGH (ref 0.44–1.00)
GFR calc Af Amer: 19 mL/min — ABNORMAL LOW (ref >60)
GFR, EST NON AFRICAN AMERICAN: 16 mL/min — AB (ref >60)
GLUCOSE: 180 mg/dL — AB (ref 65–99)
POTASSIUM: 3.7 mmol/L (ref 3.5–5.1)
Sodium: 139 mmol/L (ref 135–145)

## 2015-12-15 MED ORDER — POLYETHYLENE GLYCOL 3350 17 G PO PACK
17.0000 g | PACK | Freq: Every day | ORAL | Status: DC
  Administered 2015-12-15 – 2015-12-17 (×3): 17 g via ORAL
  Filled 2015-12-15 (×3): qty 1

## 2015-12-15 MED ORDER — SENNOSIDES-DOCUSATE SODIUM 8.6-50 MG PO TABS
1.0000 | ORAL_TABLET | Freq: Two times a day (BID) | ORAL | Status: DC
  Administered 2015-12-15 – 2015-12-17 (×4): 1 via ORAL
  Filled 2015-12-15 (×4): qty 1

## 2015-12-15 NOTE — Progress Notes (Signed)
[facility] PROGRESS NOTE  [name] [id number] DOB: 1925/12/26 DOA: 12/08/2015 PCP: Pcp Not In System  Assessment/Plan:  Mechanical fall with closed right femoral neck fracture. Mgmt per ortho Cleared for discharge per orthopedics once medical issues resolved  Acute on chronic hypoxic respiratory failure due to Pulmonary edema ( she is on oxygen at night for the last 54yrs)  Patient can not remember whether she has history of congestive heart failure, she is on Lasix daily at home though, Patient states Lasix only for fluid in her legs BiPAP when necessary order Transfer patient to stepdown unit on 8/13, remain in stepdown tonight, likely able to move to med tele tomorrow with further diuresis Troponin and BNP elevated, no chest pain, EKG sinus rhythm with pvc's, no acute st/t changes, QTc wnl Echo pending   H/o CAD with stent placement in 2009. Chest pain-free, EKG nonacute, fairly active climbs one to 2 flights of stairs without any chest pain or shortness of breath, currently compensated.  Plavix restarted. Continue home dose beta blocker. PRN IV Lopressor and hydralazine for blood pressure control.  Insulin dependent DM type II. Hold Lantus, 6.3 A1c, ACHS SSI, carb modified diet  Essential hypertension. Home medications continued, IV Lopressor and hydralazine as needed.  AKI/CK D stage III. Last creatinine about 7 years ago is 1.3 in our system, currently creatinine is close to 2, this could be her new baseline. Also had gentle hydration. Urine labs ordered - fractional excretion of urea 43.3% Contact Dr. Joana Reamer MD [phone], tomorrow, pt had recent labs with Cr 1.5-1.7, no nephro visits or consults Holding lasix initially but then patient developed dyspnea Checking renal U/S-->medical renal disease Nephrology consulted, input appreciated  Dyslipidemia. Continue home dose statin.  Anemia - falling hgb improved - no CP or ACS but family  concerned, typical post op issue - increases post op risk for new joint, ortho okay with transfusion - Transfused 2U 12/13/15  Constipation: report no BM since being admitted, start stool softener  Right periprosthetic distal femur fracture. s/p Minimally invasive plate osteosynthesis of right periprosthetic distal femur fracture on 8/8   H/o PVD s/p stent to her legs, detail unknown  H/o COPD, stable no wheezing, quit smoking many years ago   DVT Prophylaxis  SCDs     Code Status full  Likely DC to  SNF maybe 24-48hrs  Condition fair  Consults called: Orthopedics Dr. Veverly Fells, nephrology  Admission status: Inpt    Time spent in minutes : 35  HPI/Subjective: Report breathing better, but not back to baseline yet, report being constipation Patient 's daughter in room  Objective: Vitals:   12/15/15 0500 12/15/15 0755  BP: (!) 173/69 (!) 153/48  Pulse: 73 79  Resp: (!) 21 15  Temp: 97.6 F (36.4 C) 97.8 F (36.6 C)    Intake/Output Summary (Last 24 hours) at 12/15/15 1109 Last data filed at 12/15/15 0500  Gross per 24 hour  Intake              540 ml  Output             2275 ml  Net            -1735 ml   Filed Weights   12/09/15 1651 12/14/15 1610  Weight: 79.4 kg (175 lb) 88.2 kg (194 lb 7.1 oz)    Exam:  General:  No diaphoresis, calm, mild resp distress Cardiovascular: Regular rate and rhythm no murmurs rubs or gallops Respiratory:  diminished, no wheezing, no rales Abdomen: Nondistended bowel sounds normal nontender palpation Musculoskeletal: Moving all extremities, no deformity, 5 out of 5 strength; Dressing on RLE CDI, mild induration at surgical site, no erythema, no edema  Data Reviewed: Basic Metabolic Panel:  Recent Labs Lab 12/11/15 0431 12/12/15 0401 12/13/15 1016 12/14/15 1118 12/15/15 0400  NA 135 136 138 139 139  K 4.7 4.8 4.3 4.1 3.7  CL 104 107 109 111 109  CO2 23 22 22 22 24   GLUCOSE 149* 149* 217* 180* 180*  BUN 35*  51* 48* 45* 48*  CREATININE 2.35* 2.61* 2.45* 2.35* 2.48*  CALCIUM 8.2* 8.2* 8.4* 8.6* 9.0  PHOS  --  4.3 3.6  --   --    Liver Function Tests:  Recent Labs Lab 12/12/15 0401 12/13/15 1016  ALBUMIN 2.3* 2.2*   No results for input(s): LIPASE, AMYLASE in the last 168 hours. No results for input(s): AMMONIA in the last 168 hours. CBC:  Recent Labs Lab 12/09/15 0402  12/11/15 0431 12/12/15 0400 12/13/15 1016 12/14/15 1118 12/15/15 0400  WBC 8.9  < > 4.9 6.2 4.2 5.1 4.8  NEUTROABS 7.3  --   --   --  2.8  --  2.9  HGB 11.8*  < > 9.2* 8.2* 7.9* 9.9* 9.5*  HCT 36.3  < > 29.3* 26.2* 25.5* 31.3* 30.3*  MCV 88.3  < > 92.1 92.9 93.4 91.0 90.7  PLT 150  < > 98* 103* 123* 148* 169  < > = values in this interval not displayed. Cardiac Enzymes:  Recent Labs Lab 12/12/15 0401 12/14/15 1436 12/14/15 1956  CKTOTAL 253*  --   --   TROPONINI  --  0.16* 0.18*   BNP (last 3 results)  Recent Labs  12/14/15 1436  BNP 1,458.3*    ProBNP (last 3 results) No results for input(s): PROBNP in the last 8760 hours.  CBG:  Recent Labs Lab 12/14/15 1109 12/14/15 1732 12/14/15 2231 12/15/15 0554 12/15/15 0831  GLUCAP 185* 175* 165* 169* 177*    Recent Results (from the past 240 hour(s))  Surgical PCR screen     Status: None   Collection Time: 12/09/15  4:04 PM  Result Value Ref Range Status   MRSA, PCR NEGATIVE NEGATIVE Final   Staphylococcus aureus NEGATIVE NEGATIVE Final    Comment:        The Xpert SA Assay (FDA approved for NASAL specimens in patients over 66 years of age), is one component of a comprehensive surveillance program.  Test performance has been validated by [facility] for patients greater than or equal to 68 year old. It is not intended to diagnose infection nor to guide or monitor treatment.      Studies: Dg Chest Port 1 View  Result Date: 12/14/2015 CLINICAL DATA:  Tachypnea EXAM: PORTABLE CHEST 1 VIEW COMPARISON:  12/09/2015 chest radiograph.  FINDINGS: Stable cardiomediastinal silhouette with mild cardiomegaly and aortic atherosclerosis. No pneumothorax. Probable trace bilateral pleural effusions. Mild pulmonary edema. Mild bibasilar atelectasis. IMPRESSION: 1. Mild congestive heart failure. 2. Probable trace bilateral pleural effusions and mild bibasilar atelectasis. 3. Aortic atherosclerosis. Electronically Signed   By: Ilona Sorrel M.D.   On: 12/14/2015 12:30   Dg Abd Portable 1v  Result Date: 12/14/2015 CLINICAL DATA:  Constipation. EXAM: PORTABLE ABDOMEN - 1 VIEW COMPARISON:  None. FINDINGS: Normal bowel gas pattern. There is mild increased stool in the rectum. Right iliac artery vascular stent is noted. There are clips in the outer quadrant  by prior cholecystectomy. Aortoiliac vascular calcifications are noted. Soft tissues are otherwise unremarkable. Bony structures are demineralized but grossly intact. IMPRESSION: 1. No acute findings.  No evidence of bowel obstruction. 2. Mild increased stool noted in the rectum. Electronically Signed   By: Lajean Manes M.D.   On: 12/14/2015 12:27    Scheduled Meds: . sodium chloride   Intravenous Once  . antiseptic oral rinse  7 mL Mouth Rinse BID  . arformoterol  15 mcg Nebulization Q12H  . budesonide  0.25 mg Nebulization Daily  . clopidogrel  75 mg Oral Daily  . enoxaparin (LOVENOX) injection  30 mg Subcutaneous Q24H  . furosemide  60 mg Intravenous Q8H  . insulin aspart  0-9 Units Subcutaneous TID WC & HS  . labetalol  100 mg Oral BID  . NIFEdipine  30 mg Oral BID  . pravastatin  20 mg Oral q1800  . prednisoLONE acetate  1 drop Both Eyes BID   Continuous Infusions:    Principal Problem:   Right femoral fracture (HCC) Active Problems:   CAD in native artery   CKD stage G3a/A3, GFR 45 - 59 and albumin creatinine ratio >300 mg/g   Right femoral fracture, closed, initial encounter   Supracondylar fracture of femur ([place])    Time spent: 50    Neno Hohensee MD PhD [email or web address]  [facility] Pager [place]. If 7PM-7AM, please contact night-coverage at [email or web address], password [facility] 12/15/2015, 11:09 AM  LOS: 6 days

## 2015-12-15 NOTE — Progress Notes (Signed)
Physical Therapy Treatment Patient Details Name: [name] MRN: [id number] DOB: [birth year] Today's Date: 12/15/2015    History of Present Illness 82 y.o. female who fell at home and sustained a distal Rt femur fx. Pt now s/p ORIF on 12/09/15 ORIF. PMH: Rt foot fx 10/2015, Rt TKA, HTN, diabetes, COPD. Pt lives in [place] and is here visiting.     PT Comments    Pt progressing very slowly, limited by fatigue and generalized weakness as well as inability to safely keep RLE NWB when attempting to mobilize. Maximove will need to be used for transfer to chair. PT will continue to follow.   Follow Up Recommendations  SNF;Supervision for mobility/OOB     Equipment Recommendations  None recommended by PT;Other (comment)    Recommendations for Other Services       Precautions / Restrictions Precautions Precautions: Fall Precaution Comments: has had multiple falls recently Restrictions Weight Bearing Restrictions: Yes RLE Weight Bearing: Non weight bearing    Mobility  Bed Mobility Overal bed mobility: Needs Assistance;+2 for physical assistance Bed Mobility: Supine to Sit;Sit to Supine     Supine to sit: Max assist;+2 for physical assistance Sit to supine: +2 for physical assistance;Max assist   General bed mobility comments: pt able to initiate sliding legs to EOB, mod A to RLE due to pain, max A +2 for elevation of trunk and hips brought forward to EOB  Transfers Overall transfer level: Needs assistance Equipment used: Rolling walker (2 wheeled) Transfers: Sit to/from Stand Sit to Stand: +2 physical assistance;Max assist         General transfer comment: pt attempted standing 3x with max A +2 and lifted high enough one time for bed pan to be placed under as she thought she could have a bowel movement. However, pt having difficulty keeping RLE NWB, not safe to attempt transfer to chair  Ambulation/Gait             General Gait Details: unable   Stairs             Wheelchair Mobility    Modified Rankin (Stroke Patients Only)       Balance Overall balance assessment: Needs assistance Sitting-balance support: Feet supported;Bilateral upper extremity supported Sitting balance-Leahy Scale: Poor Sitting balance - Comments: pt able to maintain balance with unilateral support but not with no support, fatigues very quickly EOB Postural control: Left lateral lean Standing balance support: Bilateral upper extremity supported Standing balance-Leahy Scale: Zero Standing balance comment: cannot maintain balance in standing                    Cognition Arousal/Alertness: Awake/alert Behavior During Therapy: WFL for tasks assessed/performed Overall Cognitive Status: Within Functional Limits for tasks assessed (though sometimes a bit internally distracted)                      Exercises Total Joint Exercises Ankle Circles/Pumps: AROM;Both;10 reps;Supine Quad Sets: AROM;Right;5 reps;Supine Heel Slides:  (instructed pt to perform in bed)    General Comments General comments (skin integrity, edema, etc.): pt began to have right sided chest disomfort after back into bed, RN notified and present      Pertinent Vitals/Pain Pain Assessment: Faces Faces Pain Scale: Hurts little more Pain Location: right LE Pain Descriptors / Indicators: Grimacing;Guarding Pain Intervention(s): Limited activity within patient's tolerance;Monitored during session;Premedicated before session;Repositioned  O2 sats stable, HR between 55 and 80 bpm    Home Living  Prior Function            PT Goals (current goals can now be found in the care plan section) Acute Rehab PT Goals Patient Stated Goal: get back to being independent PT Goal Formulation: With patient Time For Goal Achievement: 12/24/15 Potential to Achieve Goals: Good Progress towards PT goals: Progressing toward goals    Frequency  Min 3X/week    PT  Plan Current plan remains appropriate    Co-evaluation PT/OT/SLP Co-Evaluation/Treatment: Yes Reason for Co-Treatment: Complexity of the patient's impairments (multi-system involvement);For patient/therapist safety PT goals addressed during session: Mobility/safety with mobility;Balance;Proper use of DME;Strengthening/ROM       End of Session Equipment Utilized During Treatment: Gait belt Activity Tolerance: Patient limited by fatigue;Patient limited by pain Patient left: in bed;with call bell/phone within reach;with bed alarm set     Time: [id number] PT Time Calculation (min) (ACUTE ONLY): 41 min  Charges:  $Therapeutic Activity: 23-37 mins                    G Codes:     Leighton Roach, PT  Acute Rehab Services  [phone]  Leighton Roach 12/15/2015, 1:17 PM

## 2015-12-15 NOTE — Care Management Note (Signed)
Case Management Note  Patient Details  Name: [name] MRN: [id number] Date of Birth: 1926/04/27  Subjective/Objective: patient is 6 days post op ORIF . Patient lives in [place], she is here in [place] visiting.  Per pt eval rec SNF,  CSW referral.                     Action/Plan:   Expected Discharge Date:   (UNKNOWN)               Expected Discharge Plan:     In-House Referral:  Clinical Social Work  Discharge planning Services  CM Consult  Post Acute Care Choice:    Choice offered to:     DME Arranged:    DME Agency:     HH Arranged:    HH Agency:     Status of Service:  In process, will continue to follow  If discussed at Long Length of Stay Meetings, dates discussed:    Additional Comments:  Zenon Mayo, RN 12/15/2015, 5:36 PM

## 2015-12-15 NOTE — Care Management Important Message (Signed)
Important Message  Patient Details  Name: [name] MRN: [id number] Date of Birth: Feb 09, 1926   Medicare Important Message Given:  Yes    Nathen May 12/15/2015, 10:48 AM

## 2015-12-15 NOTE — Progress Notes (Signed)
   Subjective:  Patient reports pain as mild to moderate.  No c/o. Transferred to SDU for volume overload.  Objective:   VITALS:   Vitals:   12/15/15 0500 12/15/15 0755 12/15/15 0836 12/15/15 0837  BP: (!) 173/69 (!) 153/48    Pulse: 73 79    Resp: (!) 21 15    Temp: 97.6 F (36.4 C) 97.8 F (36.6 C)    TempSrc: Oral Oral    SpO2: 99% 96% 97% 97%  Weight:      Height:        ABD soft Sensation intact distally Dorsiflexion/Plantar flexion intact Incision: dressing C/D/I Compartment soft Foot warm and well perfused   Lab Results  Component Value Date   WBC 4.8 12/15/2015   HGB 9.5 (L) 12/15/2015   HCT 30.3 (L) 12/15/2015   MCV 90.7 12/15/2015   PLT 169 12/15/2015   BMET    Component Value Date/Time   NA 139 12/15/2015 0400   K 3.7 12/15/2015 0400   CL 109 12/15/2015 0400   CO2 24 12/15/2015 0400   GLUCOSE 180 (H) 12/15/2015 0400   BUN 48 (H) 12/15/2015 0400   CREATININE 2.48 (H) 12/15/2015 0400   CALCIUM 9.0 12/15/2015 0400   GFRNONAA 16 (L) 12/15/2015 0400   GFRAA 19 (L) 12/15/2015 0400     Assessment/Plan: 6 Days Post-Op   Principal Problem:   Right femoral fracture (HCC) Active Problems:   CAD in native artery   CKD stage G3a/A3, GFR 45 - 59 and albumin creatinine ratio >300 mg/g   Right femoral fracture, closed, initial encounter   Supracondylar fracture of femur (HCC)   NWB RLE PT/OT for bed --> chair xfers DVT ppx: lovenox x30 days, plavix PO pain control D/C planning, SNF placement   Siera Beyersdorf, Horald Pollen 12/15/2015, 10:37 AM   Rod Can, MD Cell [phone]

## 2015-12-15 NOTE — Progress Notes (Signed)
[facility] Progress Note  S: Working with PT this morning, still weak. Breathing improved but still short of breath. From [place], [place], here visiting her daughter. S/p ORIF for right proximal femur fracture UOP 2275 ccs recorded yesterday (+2 unmeasured occurrences) She says she follows with a  Nephrologist in [place].  O:  Medications: Infusions:   Scheduled Medications: . sodium chloride   Intravenous Once  . antiseptic oral rinse  7 mL Mouth Rinse BID  . arformoterol  15 mcg Nebulization Q12H  . budesonide  0.25 mg Nebulization Daily  . clopidogrel  75 mg Oral Daily  . enoxaparin (LOVENOX) injection  30 mg Subcutaneous Q24H  . furosemide  60 mg Intravenous Q8H  . insulin aspart  0-9 Units Subcutaneous TID WC & HS  . labetalol  100 mg Oral BID  . NIFEdipine  30 mg Oral BID  . pravastatin  20 mg Oral q1800  . prednisoLONE acetate  1 drop Both Eyes BID    Scheduled Meds: . sodium chloride   Intravenous Once  . antiseptic oral rinse  7 mL Mouth Rinse BID  . arformoterol  15 mcg Nebulization Q12H  . budesonide  0.25 mg Nebulization Daily  . clopidogrel  75 mg Oral Daily  . enoxaparin (LOVENOX) injection  30 mg Subcutaneous Q24H  . furosemide  60 mg Intravenous Q8H  . insulin aspart  0-9 Units Subcutaneous TID WC & HS  . labetalol  100 mg Oral BID  . NIFEdipine  30 mg Oral BID  . pravastatin  20 mg Oral q1800  . prednisoLONE acetate  1 drop Both Eyes BID   Continuous Infusions:  PRN Meds:.acetaminophen **OR** acetaminophen, albuterol, albuterol, hydrALAZINE, HYDROcodone-acetaminophen, menthol-cetylpyridinium **OR** phenol, metoCLOPramide **OR** metoCLOPramide (REGLAN) injection, metoprolol, morphine injection, nitroGLYCERIN, ondansetron **OR** ondansetron (ZOFRAN) IV  BP (!) 153/48 (BP Location: Right Arm)   Pulse 79   Temp 97.8 F (36.6 C) (Oral)   Resp 15   Ht 5\' 4"  (1.626 m)   Wt 194 lb 7.1 oz (88.2 kg)   SpO2 97%   BMI 33.38 kg/m    Intake/Output  Summary (Last 24 hours) at 12/15/15 1323 Last data filed at 12/15/15 0500  Gross per 24 hour  Intake              540 ml  Output             2275 ml  Net            -1735 ml    Weight change:   EXAM: General: elderly frail woman, sitting up in bed, no acute distress Cardiac: RRR, no rubs, murmurs or gallops Pulm: clear to auscultation bilaterally, moving normal volumes of air Abd: soft, nontender Ext: warm and well perfused, no pedal edema Neuro: alert and oriented X3     Labs: Basic Metabolic Panel:  Recent Labs Lab 12/12/15 0401 12/13/15 1016 12/14/15 1118 12/15/15 0400  NA 136 138 139 139  K 4.8 4.3 4.1 3.7  CL 107 109 111 109  CO2 22 22 22 24   GLUCOSE 149* 217* 180* 180*  BUN 51* 48* 45* 48*  CREATININE 2.61* 2.45* 2.35* 2.48*  CALCIUM 8.2* 8.4* 8.6* 9.0  PHOS 4.3 3.6  --   --     Liver Function Tests:  Recent Labs Lab 12/12/15 0401 12/13/15 1016  ALBUMIN 2.3* 2.2*   No results for input(s): LIPASE, AMYLASE in the last 168 hours. No results for input(s): AMMONIA in the last 168 hours.  CBC:  Recent Labs Lab 12/09/15 0402  12/11/15 0431 12/12/15 0400 12/13/15 1016 12/14/15 1118 12/15/15 0400  WBC 8.9  < > 4.9 6.2 4.2 5.1 4.8  NEUTROABS 7.3  --   --   --  2.8  --  2.9  HGB 11.8*  < > 9.2* 8.2* 7.9* 9.9* 9.5*  HCT 36.3  < > 29.3* 26.2* 25.5* 31.3* 30.3*  MCV 88.3  < > 92.1 92.9 93.4 91.0 90.7  PLT 150  < > 98* 103* 123* 148* 169  < > = values in this interval not displayed.  Cardiac Enzymes:  Recent Labs Lab 12/12/15 0401 12/14/15 1436 12/14/15 1956  CKTOTAL 253*  --   --   TROPONINI  --  0.16* 0.18*    CBG:  Recent Labs Lab 12/14/15 1732 12/14/15 2231 12/15/15 0554 12/15/15 0831 12/15/15 1307  GLUCAP 175* 165* 169* 177* 241*    Iron Studies: No results for input(s): IRON, TIBC, TRANSFERRIN, FERRITIN in the last 72 hours.  ABG    Component Value Date/Time   TCO2 33 04/29/2008 1627     Studies/Results: Dg Chest  Port 1 View  Result Date: 12/14/2015 CLINICAL DATA:  Tachypnea EXAM: PORTABLE CHEST 1 VIEW COMPARISON:  12/09/2015 chest radiograph. FINDINGS: Stable cardiomediastinal silhouette with mild cardiomegaly and aortic atherosclerosis. No pneumothorax. Probable trace bilateral pleural effusions. Mild pulmonary edema. Mild bibasilar atelectasis. IMPRESSION: 1. Mild congestive heart failure. 2. Probable trace bilateral pleural effusions and mild bibasilar atelectasis. 3. Aortic atherosclerosis. Electronically Signed   By: Ilona Sorrel M.D.   On: 12/14/2015 12:30   Dg Abd Portable 1v  Result Date: 12/14/2015 CLINICAL DATA:  Constipation. EXAM: PORTABLE ABDOMEN - 1 VIEW COMPARISON:  None. FINDINGS: Normal bowel gas pattern. There is mild increased stool in the rectum. Right iliac artery vascular stent is noted. There are clips in the outer quadrant by prior cholecystectomy. Aortoiliac vascular calcifications are noted. Soft tissues are otherwise unremarkable. Bony structures are demineralized but grossly intact. IMPRESSION: 1. No acute findings.  No evidence of bowel obstruction. 2. Mild increased stool noted in the rectum. Electronically Signed   By: Lajean Manes M.D.   On: 12/14/2015 12:27   WBC 5  Hb 9.9 plt 148 UA 8/8 > negative Renal US 8/11 > 9.3/ 10.6 cm kidneys, no hydro, cortical thinning CXR 8/13 > +pulm edema, vasc congestion  Assessment/Plan:  1. SOB - 2/2 to pulmonary edema; Diuresing well on IV Lasix. She reports improvement in breathing, but still occasionally short of breath. Takes 40 mg Lasix po at home. Consider transition to oral lasix as dyspnea improves with continued monitoring of UOP and renal function.  2. CKD Stage 4 - Baseline likely between 2.0 - 2.5. SCr 2.45 >> 2.35 >> 2.48. May be up in setting of volume overload. No further management changes from a Nephrology stand point.  3. Right femoral fracture s/p ORIF - mgmt per ortho  4. COPD  5. HTN - on po labetalol, po  nifedipine, IV lasix  6. T2DM   Wilmont Olund Isidor Holts PGY2  Nephrology will sign off, please call for further assistance.

## 2015-12-15 NOTE — Evaluation (Signed)
Occupational Therapy Evaluation Patient Details Name: [name] MRN: [id number] DOB: 23-Jan-1926 Today's Date: 12/15/2015    History of Present Illness 82 y.o. female who fell at home and sustained a distal Rt femur fx. Pt now s/p ORIF on 12/09/15 ORIF. PMH: Rt foot fx 10/2015, Rt TKA, HTN, diabetes, COPD. Pt lives in [place] and is here visiting.    Clinical Impression   Pt reports she was independent with ADL PTA. Currently pt overall max assist +2 for attempts at sit to stand form EOB, min guard for UB ADL in sitting, and max assist for LB ADL at bed level. Pt with difficulty maintaining NWB status on RLE during attempts at transfers. Recommending SNF for follow up in order to maximize independence and safety with ADL and functional mobility prior to return home. Pt would benefit from continued skilled OT to address established goals.    Follow Up Recommendations  SNF;Supervision/Assistance - 24 hour    Equipment Recommendations  Other (comment) (TBD at next venue)    Recommendations for Other Services       Precautions / Restrictions Precautions Precautions: Fall Precaution Comments: has had multiple falls recently Restrictions Weight Bearing Restrictions: Yes RLE Weight Bearing: Non weight bearing      Mobility Bed Mobility Overal bed mobility: Needs Assistance;+2 for physical assistance Bed Mobility: Supine to Sit;Sit to Supine     Supine to sit: Max assist;+2 for physical assistance Sit to supine: +2 for physical assistance;Max assist   General bed mobility comments: pt able to initiate sliding legs to EOB, mod A to RLE due to pain, max A +2 for elevation of trunk and hips brought forward to EOB  Transfers Overall transfer level: Needs assistance Equipment used: Rolling walker (2 wheeled) Transfers: Sit to/from Stand Sit to Stand: +2 physical assistance;Max assist         General transfer comment: pt attempted standing 3x with max A +2 and lifted high  enough one time for bed pan to be placed under as she thought she could have a bowel movement. However, pt having difficulty keeping RLE NWB, not safe to attempt transfer to chair    Balance Overall balance assessment: Needs assistance Sitting-balance support: Feet supported;Bilateral upper extremity supported Sitting balance-Leahy Scale: Poor Sitting balance - Comments: pt able to maintain balance with unilateral support but not with no support, fatigues very quickly EOB Postural control: Left lateral lean Standing balance support: Bilateral upper extremity supported Standing balance-Leahy Scale: Zero Standing balance comment: Max assist +2 to maintain standing balance                            ADL Overall ADL's : Needs assistance/impaired Eating/Feeding: Set up;Sitting   Grooming: Min guard;Sitting   Upper Body Bathing: Min guard;Sitting   Lower Body Bathing: Maximal assistance;+2 for physical assistance;Bed level   Upper Body Dressing : Min guard;Sitting   Lower Body Dressing: Maximal assistance;+2 for physical assistance;Bed level Lower Body Dressing Details (indicate cue type and reason): Max assist to don socks sitting EOB   Toilet Transfer Details (indicate cue type and reason): Bedlevel at this time with bed pan.           General ADL Comments: Attempted sit to stand from EOB x 3 with max assist +2. Pt unable to clear bottom from EOB with difficulty maintaining NWB status on RLE. Pt c/o R sided chest pain after sitting EOB for ~10 minutes, pt returned to supine;  RN notified and assisting at end of session.     Vision Vision Assessment?: No apparent visual deficits   Perception     Praxis      Pertinent Vitals/Pain Pain Assessment: Faces Faces Pain Scale: Hurts even more Pain Location: R LE Pain Descriptors / Indicators: Grimacing;Guarding Pain Intervention(s): Limited activity within patient's tolerance;Monitored during  session;Repositioned;Premedicated before session     Hand Dominance     Extremity/Trunk Assessment Upper Extremity Assessment Upper Extremity Assessment: Generalized weakness   Lower Extremity Assessment Lower Extremity Assessment: Defer to PT evaluation   Cervical / Trunk Assessment Cervical / Trunk Assessment: Kyphotic   Communication Communication Communication: No difficulties   Cognition Arousal/Alertness: Awake/alert Behavior During Therapy: WFL for tasks assessed/performed Overall Cognitive Status: Within Functional Limits for tasks assessed (sometimes seems to be internally distracted)                     General Comments       Exercises Exercises: Total Joint     Shoulder Instructions      Home Living Family/patient expects to be discharged to:: Skilled nursing facility                                        Prior Functioning/Environment Level of Independence: Independent with assistive device(s)        Comments: Was wearing CAM walking boot prior to recent fall, used SPC when walking outside but no device inside the home. 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     OT Diagnosis: Generalized weakness;Acute pain   OT Problem List: Decreased strength;Decreased range of motion;Decreased activity tolerance;Impaired balance (sitting and/or standing);Decreased safety awareness;Decreased knowledge of use of DME or AE;Decreased knowledge of precautions;Obesity;Pain;Increased edema   OT Treatment/Interventions: Self-care/ADL training;Neuromuscular education;Energy conservation;Therapeutic exercise;DME and/or AE instruction;Therapeutic activities;Balance training;Patient/family education    OT Goals(Current goals can be found in the care plan section) Acute Rehab OT Goals Patient Stated Goal: rehab prior to home OT Goal Formulation: With patient Time For Goal Achievement: 12/29/15 Potential to Achieve Goals: Good ADL Goals Pt Will Perform  Grooming: with set-up;sitting Pt Will Perform Lower Body Bathing: with adaptive equipment;sit to/from stand;with min assist Pt Will Perform Lower Body Dressing: with adaptive equipment;sit to/from stand;with min assist Pt Will Transfer to Toilet: with mod assist;stand pivot transfer;bedside commode Pt/caregiver will Perform Home Exercise Program: Increased strength;Both right and left upper extremity;With theraband;With written HEP provided;Independently  OT Frequency: Min 2X/week   Barriers to D/C:            Co-evaluation PT/OT/SLP Co-Evaluation/Treatment: Yes Reason for Co-Treatment: Complexity of the patient's impairments (multi-system involvement);For patient/therapist safety PT goals addressed during session: Mobility/safety with mobility;Balance;Proper use of DME;Strengthening/ROM OT goals addressed during session: ADL's and self-care      End of Session Equipment Utilized During Treatment: Gait belt;Rolling walker;Oxygen Nurse Communication: Mobility status;Other (comment) (pt with R sided chest pain)  Activity Tolerance: Patient limited by pain;Patient limited by fatigue Patient left: in bed;with call bell/phone within reach;with bed alarm set   Time: [id number] OT Time Calculation (min): 40 min Charges:  OT General Charges $OT Visit: 1 Procedure OT Evaluation $OT Eval Moderate Complexity: 1 Procedure G-Codes:     Binnie Kand M.S., OTR/L Pager: [phone]  12/15/2015, 2:19 PM

## 2015-12-16 ENCOUNTER — Inpatient Hospital Stay (HOSPITAL_COMMUNITY): Payer: Medicare Other

## 2015-12-16 DIAGNOSIS — J410 Simple chronic bronchitis: Secondary | ICD-10-CM

## 2015-12-16 LAB — COMPREHENSIVE METABOLIC PANEL
ALBUMIN: 2.6 g/dL — AB (ref 3.5–5.0)
ALK PHOS: 77 U/L (ref 38–126)
ALT: 32 U/L (ref 14–54)
ANION GAP: 11 (ref 5–15)
AST: 17 U/L (ref 15–41)
BUN: 54 mg/dL — ABNORMAL HIGH (ref 6–20)
CALCIUM: 9.3 mg/dL (ref 8.9–10.3)
CHLORIDE: 103 mmol/L (ref 101–111)
CO2: 27 mmol/L (ref 22–32)
Creatinine, Ser: 2.79 mg/dL — ABNORMAL HIGH (ref 0.44–1.00)
GFR calc Af Amer: 16 mL/min — ABNORMAL LOW (ref >60)
GFR calc non Af Amer: 14 mL/min — ABNORMAL LOW (ref >60)
GLUCOSE: 183 mg/dL — AB (ref 65–99)
POTASSIUM: 3.3 mmol/L — AB (ref 3.5–5.1)
SODIUM: 141 mmol/L (ref 135–145)
Total Bilirubin: 0.8 mg/dL (ref 0.3–1.2)
Total Protein: 5.5 g/dL — ABNORMAL LOW (ref 6.5–8.1)

## 2015-12-16 LAB — GLUCOSE, CAPILLARY
GLUCOSE-CAPILLARY: 239 mg/dL — AB (ref 65–99)
Glucose-Capillary: 176 mg/dL — ABNORMAL HIGH (ref 65–99)
Glucose-Capillary: 200 mg/dL — ABNORMAL HIGH (ref 65–99)
Glucose-Capillary: 281 mg/dL — ABNORMAL HIGH (ref 65–99)

## 2015-12-16 LAB — CBC
HCT: 32.5 % — ABNORMAL LOW (ref 36.0–46.0)
HEMOGLOBIN: 10.5 g/dL — AB (ref 12.0–15.0)
MCH: 29.7 pg (ref 26.0–34.0)
MCHC: 32.3 g/dL (ref 30.0–36.0)
MCV: 92.1 fL (ref 78.0–100.0)
PLATELETS: 214 10*3/uL (ref 150–400)
RBC: 3.53 MIL/uL — AB (ref 3.87–5.11)
RDW: 15 % (ref 11.5–15.5)
WBC: 5.9 10*3/uL (ref 4.0–10.5)

## 2015-12-16 LAB — ECHOCARDIOGRAM COMPLETE
HEIGHTINCHES: 64 in
WEIGHTICAEL: 3111.13 [oz_av]

## 2015-12-16 LAB — BRAIN NATRIURETIC PEPTIDE: B Natriuretic Peptide: 762.8 pg/mL — ABNORMAL HIGH (ref 0.0–100.0)

## 2015-12-16 LAB — TROPONIN I: Troponin I: 0.08 ng/mL (ref <0.03)

## 2015-12-16 MED ORDER — POTASSIUM CHLORIDE CRYS ER 20 MEQ PO TBCR
40.0000 meq | EXTENDED_RELEASE_TABLET | Freq: Once | ORAL | Status: AC
  Administered 2015-12-16: 40 meq via ORAL
  Filled 2015-12-16: qty 2

## 2015-12-16 MED ORDER — FUROSEMIDE 40 MG PO TABS
40.0000 mg | ORAL_TABLET | Freq: Every day | ORAL | Status: DC
  Administered 2015-12-17: 40 mg via ORAL
  Filled 2015-12-16: qty 1

## 2015-12-16 MED ORDER — PERFLUTREN LIPID MICROSPHERE
1.0000 mL | INTRAVENOUS | Status: AC | PRN
  Administered 2015-12-16: 2 mL via INTRAVENOUS
  Filled 2015-12-16: qty 10

## 2015-12-16 NOTE — Progress Notes (Signed)
Patient transferred to room 6N05 from [address] in bed. Alert and oriented x4. Oriented to room and call bell.

## 2015-12-16 NOTE — Progress Notes (Addendum)
[facility] PROGRESS NOTE  Tasmine Giesbrecht [id number] DOB: 02-27-1926 DOA: 12/08/2015 PCP: Pcp Not In System  Brief summary:  h/o hypertension, diabetes mellitus, COPD, asthma, coming from [facility] for visiting, who presents with right distal femur fracture after fall. Stabilized with plate/screws, developed volume overload, respiratory distress, transferred to stepdown on 8/13, better, likely d/c to snf in 24-48hrs.     Assessment/Plan:  Mechanical fall with closed right femoral neck fracture. Mgmt per ortho Cleared for discharge per orthopedics once medical issues resolved  Acute on chronic hypoxic respiratory failure due to Pulmonary edema ( she is on oxygen at night for the last 58yrs, she report baseline DOE)  Patient can not remember whether she has history of congestive heart failure, she is on Lasix daily at home though, Patient states Lasix only for fluid in her legs  Patient is transferred to stepdown unit on 8/13 due to respiratory distress, she improved with diuresis,  move to med tele on 8/15 Elevated Troponin and BNP  Which are all trending down, no chest pain, EKG sinus rhythm with pvc's, no acute st/t changes, QTc wnl, Echo no wall motion abnormalities, LVEF wnl, does has grade 1 diastolic dysfunction.  Breathing better, cr trending up, decrease lasix dose on 8/15  AKI/CK D stage III.  Last creatinine about 7 years ago is 1.3 in our system. Urine labs ordered - fractional excretion of urea 43.3%. ua no infection Obtain Medical record from Dr. Joana Reamer MD [phone],  Holding lasix initially but then patient developed dyspnea/pulmonary edema renal U/S-->medical renal disease Nephrology consulted, input appreciated Cr trending up, breathing back to baseline, change iv lasix 60mg  tid to home dose oral lasix    H/o CAD with stent placement in 2009. Chest pain-free, EKG nonacute, fairly active climbs one to 2 flights of stairs without any chest pain or  shortness of breath, currently compensated.  Plavix restarted. Continue home dose beta blocker. PRN IV Lopressor and hydralazine for blood pressure control.  Insulin dependent DM type II. Hold Lantus, 6.3 A1c, ACHS SSI, carb modified diet  Essential hypertension. Home medications continued, IV Lopressor and hydralazine as needed.   Dyslipidemia. Continue home dose statin.  Anemia - falling hgb improved - no CP or ACS but family concerned, typical post op issue - increases post op risk for new joint, ortho okay with transfusion - Transfused 2U 12/13/15  Constipation: report no BM since being admitted, start stool softener on 8/14 with good result  Right periprosthetic distal femur fracture. s/p Minimally invasive plate osteosynthesis of right periprosthetic distal femur fracture on 8/8   H/o PVD s/p stent to her legs, detail unknown  H/o COPD, stable no wheezing, quit smoking many years ago, report baseline poor respiratory reserve, has sob at rest at baseline, she has been on o2 at night for the last 60yrs, lung exam very diminished overall.   DVT Prophylaxis  SCDs     Code Status full  Likely DC to  SNF maybe 24-48hrs, if cr stable with decreasing dose of lasix  Condition fair  Consults called: Orthopedics Dr. Veverly Fells, nephrology  Admission status: Inpt    Time spent in minutes : 35  HPI/Subjective: Report breathing better, now back to baseline, had bm  today Grate grand son in room  Objective: Vitals:   12/16/15 1027 12/16/15 1129  BP: (!) 139/58 (!) 154/56  Pulse: 68 64  Resp: 14 13  Temp:  97.7 F (36.5 C)    Intake/Output Summary (Last  24 hours) at 12/16/15 1328 Last data filed at 12/16/15 1025  Gross per 24 hour  Intake             1320 ml  Output             2351 ml  Net            -1031 ml   Filed Weights   12/09/15 1651 12/14/15 1610  Weight: 79.4 kg (175 lb) 88.2 kg (194 lb 7.1 oz)    Exam:  General:  No diaphoresis, calm,  mild resp distress Cardiovascular: Regular rate and rhythm no murmurs rubs or gallops Respiratory: diminished, no wheezing, no rales Abdomen: Nondistended bowel sounds normal nontender palpation Musculoskeletal: Moving all extremities, no deformity, 5 out of 5 strength; Dressing on RLE CDI, mild induration at surgical site, no erythema, no edema  Data Reviewed: Basic Metabolic Panel:  Recent Labs Lab 12/12/15 0401 12/13/15 1016 12/14/15 1118 12/15/15 0400 12/16/15 0550  NA 136 138 139 139 141  K 4.8 4.3 4.1 3.7 3.3*  CL 107 109 111 109 103  CO2 22 22 22 24 27   GLUCOSE 149* 217* 180* 180* 183*  BUN 51* 48* 45* 48* 54*  CREATININE 2.61* 2.45* 2.35* 2.48* 2.79*  CALCIUM 8.2* 8.4* 8.6* 9.0 9.3  PHOS 4.3 3.6  --   --   --    Liver Function Tests:  Recent Labs Lab 12/12/15 0401 12/13/15 1016 12/16/15 0550  AST  --   --  17  ALT  --   --  32  ALKPHOS  --   --  77  BILITOT  --   --  0.8  PROT  --   --  5.5*  ALBUMIN 2.3* 2.2* 2.6*   No results for input(s): LIPASE, AMYLASE in the last 168 hours. No results for input(s): AMMONIA in the last 168 hours. CBC:  Recent Labs Lab 12/12/15 0400 12/13/15 1016 12/14/15 1118 12/15/15 0400 12/16/15 0550  WBC 6.2 4.2 5.1 4.8 5.9  NEUTROABS  --  2.8  --  2.9  --   HGB 8.2* 7.9* 9.9* 9.5* 10.5*  HCT 26.2* 25.5* 31.3* 30.3* 32.5*  MCV 92.9 93.4 91.0 90.7 92.1  PLT 103* 123* 148* 169 214   Cardiac Enzymes:  Recent Labs Lab 12/12/15 0401 12/14/15 1436 12/14/15 1956 12/16/15 0550  CKTOTAL 253*  --   --   --   TROPONINI  --  0.16* 0.18* 0.08*   BNP (last 3 results)  Recent Labs  12/14/15 1436 12/16/15 0550  BNP 1,458.3* 762.8*    ProBNP (last 3 results) No results for input(s): PROBNP in the last 8760 hours.  CBG:  Recent Labs Lab 12/15/15 1307 12/15/15 1656 12/15/15 2150 12/16/15 0743 12/16/15 1128  GLUCAP 241* 305* 188* 176* 200*    Recent Results (from the past 240 hour(s))  Surgical PCR screen      Status: None   Collection Time: 12/09/15  4:04 PM  Result Value Ref Range Status   MRSA, PCR NEGATIVE NEGATIVE Final   Staphylococcus aureus NEGATIVE NEGATIVE Final    Comment:        The Xpert SA Assay (FDA approved for NASAL specimens in patients over 61 years of age), is one component of a comprehensive surveillance program.  Test performance has been validated by [facility] for patients greater than or equal to 69 year old. It is not intended to diagnose infection nor to guide or monitor treatment.  Studies: No results found.  Scheduled Meds: . antiseptic oral rinse  7 mL Mouth Rinse BID  . arformoterol  15 mcg Nebulization Q12H  . budesonide  0.25 mg Nebulization Daily  . clopidogrel  75 mg Oral Daily  . enoxaparin (LOVENOX) injection  30 mg Subcutaneous Q24H  . [START ON 12/17/2015] furosemide  40 mg Oral Daily  . insulin aspart  0-9 Units Subcutaneous TID WC & HS  . labetalol  100 mg Oral BID  . NIFEdipine  30 mg Oral BID  . polyethylene glycol  17 g Oral Daily  . pravastatin  20 mg Oral q1800  . prednisoLONE acetate  1 drop Both Eyes BID  . senna-docusate  1 tablet Oral BID   Continuous Infusions:    Principal Problem:   Right femoral fracture (HCC) Active Problems:   CAD in native artery   CKD stage G3a/A3, GFR 45 - 59 and albumin creatinine ratio >300 mg/g   Right femoral fracture, closed, initial encounter   Supracondylar fracture of femur ([place])    Time spent: 67    Oralia Criger MD PhD [email or web address]  [facility] Pager Amion. If 7PM-7AM, please contact night-coverage at [email or web address], password [facility] 12/16/2015, 1:28 PM  LOS: 7 days

## 2015-12-16 NOTE — Progress Notes (Signed)
  Echocardiogram 2D Echocardiogram has been performed, attempted to use definity. Aggie Cosier 12/16/2015, 8:37 AM

## 2015-12-17 DIAGNOSIS — N189 Chronic kidney disease, unspecified: Secondary | ICD-10-CM

## 2015-12-17 DIAGNOSIS — S72451D Displaced supracondylar fracture without intracondylar extension of lower end of right femur, subsequent encounter for closed fracture with routine healing: Secondary | ICD-10-CM

## 2015-12-17 DIAGNOSIS — S72309D Unspecified fracture of shaft of unspecified femur, subsequent encounter for closed fracture with routine healing: Secondary | ICD-10-CM

## 2015-12-17 DIAGNOSIS — N184 Chronic kidney disease, stage 4 (severe): Secondary | ICD-10-CM

## 2015-12-17 DIAGNOSIS — I10 Essential (primary) hypertension: Secondary | ICD-10-CM

## 2015-12-17 DIAGNOSIS — N179 Acute kidney failure, unspecified: Secondary | ICD-10-CM

## 2015-12-17 DIAGNOSIS — J9621 Acute and chronic respiratory failure with hypoxia: Secondary | ICD-10-CM

## 2015-12-17 LAB — GLUCOSE, CAPILLARY
GLUCOSE-CAPILLARY: 279 mg/dL — AB (ref 65–99)
Glucose-Capillary: 273 mg/dL — ABNORMAL HIGH (ref 65–99)

## 2015-12-17 LAB — TYPE AND SCREEN
ABO/RH(D): O POS
Antibody Screen: NEGATIVE
UNIT DIVISION: 0
UNIT DIVISION: 0
Unit division: 0
Unit division: 0

## 2015-12-17 LAB — BASIC METABOLIC PANEL
ANION GAP: 8 (ref 5–15)
BUN: 59 mg/dL — ABNORMAL HIGH (ref 6–20)
CO2: 27 mmol/L (ref 22–32)
Calcium: 8.9 mg/dL (ref 8.9–10.3)
Chloride: 104 mmol/L (ref 101–111)
Creatinine, Ser: 2.81 mg/dL — ABNORMAL HIGH (ref 0.44–1.00)
GFR calc Af Amer: 16 mL/min — ABNORMAL LOW (ref >60)
GFR, EST NON AFRICAN AMERICAN: 14 mL/min — AB (ref >60)
GLUCOSE: 146 mg/dL — AB (ref 65–99)
POTASSIUM: 4 mmol/L (ref 3.5–5.1)
SODIUM: 139 mmol/L (ref 135–145)

## 2015-12-17 MED ORDER — SENNOSIDES-DOCUSATE SODIUM 8.6-50 MG PO TABS: 1.0000 | ORAL_TABLET | Freq: Two times a day (BID) | ORAL | Status: AC

## 2015-12-17 MED ORDER — TRAMADOL HCL 50 MG PO TABS: 50.0000 mg | ORAL_TABLET | Freq: Four times a day (QID) | ORAL | 0 refills | Status: AC | PRN

## 2015-12-17 MED ORDER — HYDROCODONE-ACETAMINOPHEN 5-325 MG PO TABS: 1.0000 | ORAL_TABLET | Freq: Four times a day (QID) | ORAL | 0 refills | Status: DC | PRN

## 2015-12-17 NOTE — Progress Notes (Signed)
Report has been given. Patient is ready and [facility] has come to pick patient up.

## 2015-12-17 NOTE — Progress Notes (Signed)
Physical Therapy Treatment Patient Details Name: [name] MRN: [id number] DOB: 1926-01-01 Today's Date: 12/17/2015    History of Present Illness 82 y.o. female who fell at home and sustained a distal Rt femur fx. Pt now s/p ORIF on 12/09/15 ORIF. PMH: Rt foot fx 10/2015, Rt TKA, HTN, diabetes, COPD. Pt lives in [place] and is here visiting.     PT Comments    Pt performed limited mobility to bed level due to transport arriving soon to take to SNF.  Pt fatigues easily and presents with strength deficits which would continue to improve with rehab in a post acute setting. Pt continues to be motivated to participate with therapy.    Follow Up Recommendations  SNF;Supervision for mobility/OOB     Equipment Recommendations  None recommended by PT;Other (comment)    Recommendations for Other Services       Precautions / Restrictions Precautions Precautions: Fall Precaution Comments: has had multiple falls recently Restrictions Weight Bearing Restrictions: Yes RLE Weight Bearing: Non weight bearing Other Position/Activity Restrictions: most recent progress note by Dr Delfino Lovett indicates NWB    Mobility  Bed Mobility Overal bed mobility: Needs Assistance Bed Mobility: Rolling Rolling: Min guard;Min assist         General bed mobility comments: Pt performed rolling to R for placement of bed pan.  Pt required min guard assist to roll to the right and min assist to roll to the L.  Pt required multiple bouts of rolling to remove bed pan and perform perianal care.  Pt fatigues with limited activity.    Transfers                 General transfer comment: PTA deferred OOB activity as transport to arrive soon to transport patient to SNF.    Ambulation/Gait                 Stairs            Wheelchair Mobility    Modified Rankin (Stroke Patients Only)       Balance Overall balance assessment: Needs assistance                                   Cognition Arousal/Alertness: Awake/alert Behavior During Therapy: WFL for tasks assessed/performed Overall Cognitive Status: Within Functional Limits for tasks assessed                      Exercises Total Joint Exercises Ankle Circles/Pumps: AROM;Both;10 reps;Supine Quad Sets: AROM;Right;Supine;10 reps Short Arc Quad: Right;10 reps;Supine;AAROM Heel Slides: Right;10 reps;Supine;AAROM Hip ABduction/ADduction: AAROM;Right;10 reps;Supine Straight Leg Raises: AAROM;Right;10 reps;Supine    General Comments        Pertinent Vitals/Pain Pain Assessment: 0-10 Pain Score: 7  Pain Location: RLE with movement Pain Descriptors / Indicators: Grimacing;Guarding Pain Intervention(s): Monitored during session;Repositioned;Ice applied    Home Living                      Prior Function            PT Goals (current goals can now be found in the care plan section) Acute Rehab PT Goals Patient Stated Goal: rehab prior to home Potential to Achieve Goals: Good Additional Goals Additional Goal #1: Pt to demonstrate understanding of w/c mobility basic skills.   Progress towards PT goals: Progressing toward goals    Frequency  Min 3X/week  PT Plan Current plan remains appropriate    Co-evaluation             End of Session   Activity Tolerance: Patient limited by fatigue;Patient limited by pain Patient left: in bed;with call bell/phone within reach;with bed alarm set     Time: [id number] PT Time Calculation (min) (ACUTE ONLY): 23 min  Charges:  $Therapeutic Exercise: 8-22 mins $Therapeutic Activity: 8-22 mins                    G Codes:      Cristela Blue December 24, 2015, 4:43 PM  Governor Rooks, PTA pager [phone]

## 2015-12-17 NOTE — Discharge Summary (Signed)
Physician Discharge Summary  Rokisha Obrion [id number] DOB: 1925/10/18 DOA: 12/08/2015  PCP: Pcp Not In System  Admit date: 12/08/2015 Discharge date: 12/17/2015  Admitted From: Home Disposition:  SNF  Recommendations for Outpatient Follow-up:  1. Follow up with PCP in 1-2 weeks 2. Please obtain BMP/CBC in one week 3. Please wean oxygen off for saturation >92% 4. Patient wears 2 L Nasal cannula at night time   Discharge Condition: stable CODE STATUS: FULL Diet recommendation: Heart Healthy    Brief/Interim Summary: 82 y/o h/o CKD 4, hypertension, diabetes mellitus, COPD, asthma, and chronic respiratory failure on 2 L at night time coming from [facility] for visiting, who presents with right distal femur fracture after fall. Stabilized with plate/screws, developed volume overload, respiratory distress, transferred to stepdown on 8/13.  She developed pulmomary edema due to fluid resuscitation.  She was started on IV lasix with good clinical effect.  She was de-escalated to po lasix and remained stable.  Discharge Diagnoses:  Mechanical fall with closed right femoral neck fracture. Mgmt per ortho S/p Minimally invasive plate osteosynthesis of right periprosthetic distal femur fracture on 12/09/15--Dr. Aaron Edelman Swinteck  Acute on chronic hypoxic respiratory failure with hypoxia due to Pulmonary edema ( she is on oxygen at night for the last 71yrs, she report baseline DOE)  Patient can not remember whether she has history of congestive heart failure, she is on Lasix daily at home though, Patient states Lasix only for fluid in her legs  Patient is transferred to stepdown unit on 8/13 due to respiratory distress, she improved with IV diuresis,  move to med tele on 8/15 Elevated Troponin  -due to pulmonary edema and demand ischemia  Which are all trending down, no chest pain, EKG sinus rhythm with pvc's, no acute st/t changes, QTc wnl, Echo no wall motion abnormalities, LVEF wnl, does has grade  1 diastolic dysfunction. -Echo EF 55-60%, grade 1 DD  Breathing better, cr trending up, decrease lasix dose on 8/15--d/c with lasix 40 mg po daily -BMP in one week  AKI on chronic renal failure--CKD  stage IV.  Last creatinine about 7 years ago is 1.3 in our system. Urine labs ordered - fractional excretion of urea 43.3%. -ua no infection Obtain Medical record from Dr. Joana Reamer MD [phone],  -Holding lasix initially but then patient developed dyspnea/pulmonary edema renal U/S-->medical renal disease Nephrology consulted, input appreciated Cr trending up, breathing back to baseline, change iv lasix 60mg  tid to home dose oral lasix  -baseline creatinine 2.0-2.5 -stop mobic   H/o CAD with stent placement in 2009. Chest pain-free, EKG nonacute, fairly active climbs one to 2 flights of stairs without any chest pain or shortness of breath, currently compensated.  Plavix restarted. Continue home dose beta blocker. PRN IV Lopressor for blood pressure control.  Insulin dependent DM type II. Hold Lantus, 6.3 A1c, ACHS SSI, carb modified diet -restart Toujeo home dose after d/c  Essential hypertension. Home medications continued, IV Lopressor and hydralazine as needed.   Dyslipidemia. Continue home dose statin.  Anemia - falling hgb improved/stable -baseline Hgb 10 - no CP or ACS but family concerned, typical post op issue - increases post op risk for new joint, ortho okay with transfusion - Transfused 2U 12/13/15  Constipation: report no BM since being admitted, start stool softener on 8/14 with good result  Right periprosthetic distal femur fracture. s/p Minimally invasive plate osteosynthesis of right periprosthetic distal femur fracture on 8/8   H/o PVD s/p stent to  her legs, detail unknown  H/o COPD, stable no wheezing, quit smoking many years ago, report baseline poor respiratory reserve, has sob at rest at baseline, she has been on o2 at night for  the last 26yrs,   Discharge Instructions  Discharge Instructions    Diet - low sodium heart healthy    Complete by:  As directed   Increase activity slowly    Complete by:  As directed       Medication List    STOP taking these medications   meloxicam 7.5 MG tablet Commonly known as:  MOBIC     TAKE these medications   anastrozole 1 MG tablet Commonly known as:  ARIMIDEX Take 1 mg by mouth daily.   b complex vitamins tablet Take 1 tablet by mouth daily.   budesonide 0.25 MG/2ML nebulizer solution Commonly known as:  PULMICORT Take 0.25 mg by nebulization daily.   clopidogrel 75 MG tablet Commonly known as:  PLAVIX Take 75 mg by mouth daily.   furosemide 40 MG tablet Commonly known as:  LASIX Take 40 mg by mouth daily.   Garlic 123XX123 MG Caps Take 1,000 mg by mouth daily.   HYDROcodone-acetaminophen 5-325 MG tablet Commonly known as:  NORCO/VICODIN Take 1-2 tablets by mouth every 6 (six) hours as needed for moderate pain.   Iron 325 (65 Fe) MG Tabs Take 325 mg by mouth daily.   labetalol 100 MG tablet Commonly known as:  NORMODYNE Take 100 mg by mouth 2 (two) times daily.   lovastatin 20 MG tablet Commonly known as:  MEVACOR Take 20 mg by mouth daily.   NIFEdipine 30 MG 24 hr tablet Commonly known as:  PROCARDIA-XL/ADALAT CC Take 30 mg by mouth 2 (two) times daily.   nitroGLYCERIN 0.4 MG SL tablet Commonly known as:  NITROSTAT Place 0.4 mg under the tongue every 5 (five) minutes as needed for chest pain.   OCUTABS Tabs Take 1 tablet by mouth daily.   OMEGA-3 PLUS PO Take 1 capsule by mouth 2 (two) times daily.   PERFOROMIST 20 MCG/2ML nebulizer solution Generic drug:  formoterol Take 20 mcg by nebulization daily.   potassium chloride SA 20 MEQ tablet Commonly known as:  K-DUR,KLOR-CON Take 20 mEq by mouth daily.   prednisoLONE acetate 1 % ophthalmic suspension Commonly known as:  PRED FORTE 3 drops 2 (two) times daily.   REFRESH OP Place  1 drop into both eyes 2 (two) times daily.   saxagliptin HCl 2.5 MG Tabs tablet Commonly known as:  ONGLYZA Take 2.5 mg by mouth daily.   senna-docusate 8.6-50 MG tablet Commonly known as:  Senokot-S Take 1 tablet by mouth 2 (two) times daily.   TOUJEO SOLOSTAR 300 UNIT/ML Sopn Generic drug:  Insulin Glargine Inject 10 Units into the skin daily.   traMADol 50 MG tablet Commonly known as:  ULTRAM Take 1 tablet (50 mg total) by mouth every 6 (six) hours as needed for moderate pain.   VENTOLIN HFA 108 (90 Base) MCG/ACT inhaler Generic drug:  albuterol Inhale 2 puffs into the lungs every 4 (four) hours as needed for wheezing or shortness of breath.      Follow-up Information    Swinteck, Horald Pollen, MD. Schedule an appointment as soon as possible for a visit in 2 weeks.   Specialty:  Orthopedic Surgery Why:  For wound re-check Contact information: [address] [place] [address] [phone]          Allergies  Allergen  Reactions  . Sulfa Antibiotics Other (See Comments)    Reaction unknown  . Tape Other (See Comments)    Blisters  . Penicillins Rash    Has patient had a PCN reaction causing immediate rash, facial/tongue/throat swelling, SOB or lightheadedness with hypotension: yes Has patient had a PCN reaction causing severe rash involving mucus membranes or skin necrosis: no Has patient had a PCN reaction that required hospitalization yes as a teenager Has patient had a PCN reaction occurring within the last 10 years: no was a teenager If all of the above answers are "NO", then may proceed with Cephalosporin use.     Consultations:  Orthopedics--Brian Swinteck   Procedures/Studies: Dg Chest 1 View  Result Date: 12/09/2015 CLINICAL DATA:  [place] tonight. History of COPD, hypertension, diabetes, LEFT breast cancer. EXAM: CHEST 1 VIEW COMPARISON:  Chest radiograph April 29, 2008 FINDINGS: Cardiomediastinal silhouette is normal for this  apical lordotic technique. Calcified aortic knob. Large hiatal hernia. Hepatic eventration. No pneumothorax. Osteopenia. IMPRESSION: No acute cardiopulmonary process considering apical lordotic technique. Large hiatal hernia. Electronically Signed   By: Elon Alas M.D.   On: 12/09/2015 01:56   US Renal  Result Date: 12/12/2015 CLINICAL DATA:  Acute on chronic renal failure, diabetes mellitus, COPD, asthma, hypertension, former smoker EXAM: RENAL / URINARY TRACT ULTRASOUND COMPLETE COMPARISON:  None FINDINGS: Right Kidney: Length: 10.6 cm. Cortical thinning. Increased cortical echogenicity. Two small cysts identified, mid kidney 12 x 13 x 15 mm and peripelvic 23 x 21 x 18 mm. No definite solid mass or hydronephrosis. No shadowing calcifications. Left Kidney: Length: 9.3 cm. Cortical thinning. Increased cortical echogenicity. Tiny cyst upper pole 12 x 10 x 12 mm, minimally complicated with few internal echoes. Bladder: Well distended, normal appearance IMPRESSION: Medical renal disease changes of both kidneys. BILATERAL small renal cysts, with a minimally complicated 12 mm cyst upper pole LEFT kidney. Electronically Signed   By: Lavonia Dana M.D.   On: 12/12/2015 18:14   Ct Femur Right Wo Contrast  Result Date: 12/09/2015 CLINICAL DATA:  Acute distal right femur fracture. EXAM: CT OF THE RIGHT FEMUR WITHOUT CONTRAST TECHNIQUE: Multidetector CT imaging was performed according to the standard protocol. Multiplanar CT image reconstructions were also generated. COMPARISON:  Radiographs dated 12/09/2015 FINDINGS: There is a comminuted fracture of the distal femoral metaphysis just above the femoral component of the total knee prosthesis. There is a 15 mm of posterior displacement of the distal fragment. The fracture does not extend into the femoral condyles. The patella is intact. Proximal tibia and fibula are intact. No loosening of the prosthesis components. Small amount of hemorrhage into the adjacent soft  tissues at the fracture site. The proximal femur appears normal. Minimal marginal osteophytes on the right acetabulum. IMPRESSION: Comminuted distal right femoral metaphysis with 15 mm of posterior displacement of the distal fragment. No extension into the femoral condyles. Electronically Signed   By: Lorriane Shire M.D.   On: 12/09/2015 08:27   Dg Chest Port 1 View  Result Date: 12/14/2015 CLINICAL DATA:  Tachypnea EXAM: PORTABLE CHEST 1 VIEW COMPARISON:  12/09/2015 chest radiograph. FINDINGS: Stable cardiomediastinal silhouette with mild cardiomegaly and aortic atherosclerosis. No pneumothorax. Probable trace bilateral pleural effusions. Mild pulmonary edema. Mild bibasilar atelectasis. IMPRESSION: 1. Mild congestive heart failure. 2. Probable trace bilateral pleural effusions and mild bibasilar atelectasis. 3. Aortic atherosclerosis. Electronically Signed   By: Ilona Sorrel M.D.   On: 12/14/2015 12:30   Dg Knee Complete 4 Views Left  Result Date:  12/09/2015 CLINICAL DATA:  Fall at home in her den onto carpeted floor. Bilateral knee pain. EXAM: LEFT KNEE - COMPLETE 4+ VIEW COMPARISON:  None. FINDINGS: No fracture or dislocation. The alignment is maintained. Mild medial tibial femoral joint space narrowing. No joint effusion. Small quadriceps enthesophyte. There is mild prepatellar soft tissue edema. Vascular calcifications are seen. IMPRESSION: No acute fracture or subluxation. Mild medial tibial femoral joint space narrowing. Electronically Signed   By: Jeb Levering M.D.   On: 12/09/2015 01:54   Dg Knee Complete 4 Views Right  Result Date: 12/09/2015 CLINICAL DATA:  Fall at home in her den onto carpeted floor. Bilateral knee pain. EXAM: RIGHT KNEE - COMPLETE 4+ VIEW COMPARISON:  None. FINDINGS: Displaced angulated distal femur fracture just proximal to right knee arthroplasty. Approximately 2 cm of impaction. There is apex lateral angulation and subluxation of the distal fracture fragment. Fracture  extends to the femoral component of the arthroplasty anteriorly. There is been patellar resurfacing. Tibial component of the arthroplasty is intact. There is a lipohemarthrosis. Vascular calcifications are seen. IMPRESSION: Displaced angulated periprosthetic fracture of the distal femur extending to the femoral component of the right knee arthroplasty. Lipohemarthrosis, small to moderate in size. Electronically Signed   By: Jeb Levering M.D.   On: 12/09/2015 01:57   Dg Abd Portable 1v  Result Date: 12/14/2015 CLINICAL DATA:  Constipation. EXAM: PORTABLE ABDOMEN - 1 VIEW COMPARISON:  None. FINDINGS: Normal bowel gas pattern. There is mild increased stool in the rectum. Right iliac artery vascular stent is noted. There are clips in the outer quadrant by prior cholecystectomy. Aortoiliac vascular calcifications are noted. Soft tissues are otherwise unremarkable. Bony structures are demineralized but grossly intact. IMPRESSION: 1. No acute findings.  No evidence of bowel obstruction. 2. Mild increased stool noted in the rectum. Electronically Signed   By: Lajean Manes M.D.   On: 12/14/2015 12:27   Dg C-arm 61-120 Min  Result Date: 12/09/2015 CLINICAL DATA:  ORIF for distal femur fracture. EXAM: RIGHT FEMUR PORTABLE 1 VIEW; DG C-ARM 61-120 MIN COMPARISON:  Earlier the same day. FINDINGS: Image 1 shows the distal right femur fracture which is demonstrated and lateral projection on image 2. Image 3 shows a long cortical plate and screw fixation device with 3 or 4 metaphyseal screws. Image 4 shows the proximal end of the cortical plate. Images 5 and 6 shows a plate and lateral projection. IMPRESSION: Status post ORIF for distal femur fracture this patient status post total knee replacement. No evidence for immediate hardware complications. Electronically Signed   By: Misty Stanley M.D.   On: 12/09/2015 20:22   Dg Femur [place], [place] 2 Views Right  Result Date: 12/10/2015 CLINICAL DATA:  Postoperative radiographs,  status post right femoral plate placement. EXAM: RIGHT FEMUR PORTABLE 1 VIEW COMPARISON:  Intraoperative radiographs performed earlier today at 6:40 p.m. FINDINGS: There has been placement of a plate and screws along the right femur, transfixing the distal femoral fracture in near anatomic alignment. A total knee arthroplasty is grossly unremarkable in appearance. No new fractures are identified. The right femoral head remains seated at the acetabulum. Postoperative soft tissue changes are seen. Scattered vascular calcifications are seen. IMPRESSION: Status post internal fixation of distal femoral fracture in near anatomic alignment. Total knee arthroplasty is grossly unremarkable in appearance. Electronically Signed   By: Garald Balding M.D.   On: 12/10/2015 00:02   Dg Femur [place], [place] 2 Views Right  Result Date: 12/09/2015 CLINICAL DATA:  ORIF for  distal femur fracture. EXAM: RIGHT FEMUR PORTABLE 1 VIEW; DG C-ARM 61-120 MIN COMPARISON:  Earlier the same day. FINDINGS: Image 1 shows the distal right femur fracture which is demonstrated and lateral projection on image 2. Image 3 shows a long cortical plate and screw fixation device with 3 or 4 metaphyseal screws. Image 4 shows the proximal end of the cortical plate. Images 5 and 6 shows a plate and lateral projection. IMPRESSION: Status post ORIF for distal femur fracture this patient status post total knee replacement. No evidence for immediate hardware complications. Electronically Signed   By: Misty Stanley M.D.   On: 12/09/2015 20:22         Discharge Exam: Vitals:   12/16/15 2234 12/17/15 0501  BP: (!) 153/52 (!) 141/46  Pulse: 66 62  Resp:  18  Temp:  97.9 F (36.6 C)   Vitals:   12/16/15 2234 12/17/15 0501 12/17/15 1016 12/17/15 1019  BP: (!) 153/52 (!) 141/46    Pulse: 66 62    Resp:  18    Temp:  97.9 F (36.6 C)    TempSrc:  Oral    SpO2:  98% 96% 96%  Weight:      Height:        General: Pt is alert, awake, not in acute  distress Cardiovascular: RRR, S1/S2 +, no rubs, no gallops Respiratory: no wheeze, no crackles, diminished BS Abdominal: Soft, NT, ND, bowel sounds + Extremities: no edema, no cyanosis   The results of significant diagnostics from this hospitalization (including imaging, microbiology, ancillary and laboratory) are listed below for reference.    Significant Diagnostic Studies: Dg Chest 1 View  Result Date: 12/09/2015 CLINICAL DATA:  [place] tonight. History of COPD, hypertension, diabetes, LEFT breast cancer. EXAM: CHEST 1 VIEW COMPARISON:  Chest radiograph April 29, 2008 FINDINGS: Cardiomediastinal silhouette is normal for this apical lordotic technique. Calcified aortic knob. Large hiatal hernia. Hepatic eventration. No pneumothorax. Osteopenia. IMPRESSION: No acute cardiopulmonary process considering apical lordotic technique. Large hiatal hernia. Electronically Signed   By: Elon Alas M.D.   On: 12/09/2015 01:56   US Renal  Result Date: 12/12/2015 CLINICAL DATA:  Acute on chronic renal failure, diabetes mellitus, COPD, asthma, hypertension, former smoker EXAM: RENAL / URINARY TRACT ULTRASOUND COMPLETE COMPARISON:  None FINDINGS: Right Kidney: Length: 10.6 cm. Cortical thinning. Increased cortical echogenicity. Two small cysts identified, mid kidney 12 x 13 x 15 mm and peripelvic 23 x 21 x 18 mm. No definite solid mass or hydronephrosis. No shadowing calcifications. Left Kidney: Length: 9.3 cm. Cortical thinning. Increased cortical echogenicity. Tiny cyst upper pole 12 x 10 x 12 mm, minimally complicated with few internal echoes. Bladder: Well distended, normal appearance IMPRESSION: Medical renal disease changes of both kidneys. BILATERAL small renal cysts, with a minimally complicated 12 mm cyst upper pole LEFT kidney. Electronically Signed   By: Lavonia Dana M.D.   On: 12/12/2015 18:14   Ct Femur Right Wo Contrast  Result Date: 12/09/2015 CLINICAL DATA:  Acute distal right femur  fracture. EXAM: CT OF THE RIGHT FEMUR WITHOUT CONTRAST TECHNIQUE: Multidetector CT imaging was performed according to the standard protocol. Multiplanar CT image reconstructions were also generated. COMPARISON:  Radiographs dated 12/09/2015 FINDINGS: There is a comminuted fracture of the distal femoral metaphysis just above the femoral component of the total knee prosthesis. There is a 15 mm of posterior displacement of the distal fragment. The fracture does not extend into the femoral condyles. The patella is intact. Proximal tibia and  fibula are intact. No loosening of the prosthesis components. Small amount of hemorrhage into the adjacent soft tissues at the fracture site. The proximal femur appears normal. Minimal marginal osteophytes on the right acetabulum. IMPRESSION: Comminuted distal right femoral metaphysis with 15 mm of posterior displacement of the distal fragment. No extension into the femoral condyles. Electronically Signed   By: Lorriane Shire M.D.   On: 12/09/2015 08:27   Dg Chest Port 1 View  Result Date: 12/14/2015 CLINICAL DATA:  Tachypnea EXAM: PORTABLE CHEST 1 VIEW COMPARISON:  12/09/2015 chest radiograph. FINDINGS: Stable cardiomediastinal silhouette with mild cardiomegaly and aortic atherosclerosis. No pneumothorax. Probable trace bilateral pleural effusions. Mild pulmonary edema. Mild bibasilar atelectasis. IMPRESSION: 1. Mild congestive heart failure. 2. Probable trace bilateral pleural effusions and mild bibasilar atelectasis. 3. Aortic atherosclerosis. Electronically Signed   By: Ilona Sorrel M.D.   On: 12/14/2015 12:30   Dg Knee Complete 4 Views Left  Result Date: 12/09/2015 CLINICAL DATA:  Fall at home in her den onto carpeted floor. Bilateral knee pain. EXAM: LEFT KNEE - COMPLETE 4+ VIEW COMPARISON:  None. FINDINGS: No fracture or dislocation. The alignment is maintained. Mild medial tibial femoral joint space narrowing. No joint effusion. Small quadriceps enthesophyte. There is  mild prepatellar soft tissue edema. Vascular calcifications are seen. IMPRESSION: No acute fracture or subluxation. Mild medial tibial femoral joint space narrowing. Electronically Signed   By: Jeb Levering M.D.   On: 12/09/2015 01:54   Dg Knee Complete 4 Views Right  Result Date: 12/09/2015 CLINICAL DATA:  Fall at home in her den onto carpeted floor. Bilateral knee pain. EXAM: RIGHT KNEE - COMPLETE 4+ VIEW COMPARISON:  None. FINDINGS: Displaced angulated distal femur fracture just proximal to right knee arthroplasty. Approximately 2 cm of impaction. There is apex lateral angulation and subluxation of the distal fracture fragment. Fracture extends to the femoral component of the arthroplasty anteriorly. There is been patellar resurfacing. Tibial component of the arthroplasty is intact. There is a lipohemarthrosis. Vascular calcifications are seen. IMPRESSION: Displaced angulated periprosthetic fracture of the distal femur extending to the femoral component of the right knee arthroplasty. Lipohemarthrosis, small to moderate in size. Electronically Signed   By: Jeb Levering M.D.   On: 12/09/2015 01:57   Dg Abd Portable 1v  Result Date: 12/14/2015 CLINICAL DATA:  Constipation. EXAM: PORTABLE ABDOMEN - 1 VIEW COMPARISON:  None. FINDINGS: Normal bowel gas pattern. There is mild increased stool in the rectum. Right iliac artery vascular stent is noted. There are clips in the outer quadrant by prior cholecystectomy. Aortoiliac vascular calcifications are noted. Soft tissues are otherwise unremarkable. Bony structures are demineralized but grossly intact. IMPRESSION: 1. No acute findings.  No evidence of bowel obstruction. 2. Mild increased stool noted in the rectum. Electronically Signed   By: Lajean Manes M.D.   On: 12/14/2015 12:27   Dg C-arm 61-120 Min  Result Date: 12/09/2015 CLINICAL DATA:  ORIF for distal femur fracture. EXAM: RIGHT FEMUR PORTABLE 1 VIEW; DG C-ARM 61-120 MIN COMPARISON:  Earlier  the same day. FINDINGS: Image 1 shows the distal right femur fracture which is demonstrated and lateral projection on image 2. Image 3 shows a long cortical plate and screw fixation device with 3 or 4 metaphyseal screws. Image 4 shows the proximal end of the cortical plate. Images 5 and 6 shows a plate and lateral projection. IMPRESSION: Status post ORIF for distal femur fracture this patient status post total knee replacement. No evidence for immediate hardware complications. Electronically Signed  By: Misty Stanley M.D.   On: 12/09/2015 20:22   Dg Femur [place], [place] 2 Views Right  Result Date: 12/10/2015 CLINICAL DATA:  Postoperative radiographs, status post right femoral plate placement. EXAM: RIGHT FEMUR PORTABLE 1 VIEW COMPARISON:  Intraoperative radiographs performed earlier today at 6:40 p.m. FINDINGS: There has been placement of a plate and screws along the right femur, transfixing the distal femoral fracture in near anatomic alignment. A total knee arthroplasty is grossly unremarkable in appearance. No new fractures are identified. The right femoral head remains seated at the acetabulum. Postoperative soft tissue changes are seen. Scattered vascular calcifications are seen. IMPRESSION: Status post internal fixation of distal femoral fracture in near anatomic alignment. Total knee arthroplasty is grossly unremarkable in appearance. Electronically Signed   By: Garald Balding M.D.   On: 12/10/2015 00:02   Dg Femur [place] 2 Views Right  Result Date: 12/09/2015 CLINICAL DATA:  ORIF for distal femur fracture. EXAM: RIGHT FEMUR PORTABLE 1 VIEW; DG C-ARM 61-120 MIN COMPARISON:  Earlier the same day. FINDINGS: Image 1 shows the distal right femur fracture which is demonstrated and lateral projection on image 2. Image 3 shows a long cortical plate and screw fixation device with 3 or 4 metaphyseal screws. Image 4 shows the proximal end of the cortical plate. Images 5 and 6 shows a plate and lateral projection.  IMPRESSION: Status post ORIF for distal femur fracture this patient status post total knee replacement. No evidence for immediate hardware complications. Electronically Signed   By: Misty Stanley M.D.   On: 12/09/2015 20:22     Microbiology: Recent Results (from the past 240 hour(s))  Surgical PCR screen     Status: None   Collection Time: 12/09/15  4:04 PM  Result Value Ref Range Status   MRSA, PCR NEGATIVE NEGATIVE Final   Staphylococcus aureus NEGATIVE NEGATIVE Final    Comment:        The Xpert SA Assay (FDA approved for NASAL specimens in patients over 78 years of age), is one component of a comprehensive surveillance program.  Test performance has been validated by [facility] for patients greater than or equal to 10 year old. It is not intended to diagnose infection nor to guide or monitor treatment.      Labs: Basic Metabolic Panel:  Recent Labs Lab 12/12/15 0401 12/13/15 1016 12/14/15 1118 12/15/15 0400 12/16/15 0550 12/17/15 0510  NA 136 138 139 139 141 139  K 4.8 4.3 4.1 3.7 3.3* 4.0  CL 107 109 111 109 103 104  CO2 22 22 22 24 27 27   GLUCOSE 149* 217* 180* 180* 183* 146*  BUN 51* 48* 45* 48* 54* 59*  CREATININE 2.61* 2.45* 2.35* 2.48* 2.79* 2.81*  CALCIUM 8.2* 8.4* 8.6* 9.0 9.3 8.9  PHOS 4.3 3.6  --   --   --   --    Liver Function Tests:  Recent Labs Lab 12/12/15 0401 12/13/15 1016 12/16/15 0550  AST  --   --  17  ALT  --   --  32  ALKPHOS  --   --  77  BILITOT  --   --  0.8  PROT  --   --  5.5*  ALBUMIN 2.3* 2.2* 2.6*   No results for input(s): LIPASE, AMYLASE in the last 168 hours. No results for input(s): AMMONIA in the last 168 hours. CBC:  Recent Labs Lab 12/12/15 0400 12/13/15 1016 12/14/15 1118 12/15/15 0400 12/16/15 0550  WBC 6.2 4.2 5.1  4.8 5.9  NEUTROABS  --  2.8  --  2.9  --   HGB 8.2* 7.9* 9.9* 9.5* 10.5*  HCT 26.2* 25.5* 31.3* 30.3* 32.5*  MCV 92.9 93.4 91.0 90.7 92.1  PLT 103* 123* 148* 169 214   Cardiac  Enzymes:  Recent Labs Lab 12/12/15 0401 12/14/15 1436 12/14/15 1956 12/16/15 0550  CKTOTAL 253*  --   --   --   TROPONINI  --  0.16* 0.18* 0.08*   BNP: Invalid input(s): POCBNP CBG:  Recent Labs Lab 12/16/15 0743 12/16/15 1128 12/16/15 1708 12/16/15 2206 12/17/15 0746  GLUCAP 176* 200* 281* 239* 273*    Time coordinating discharge:  Greater than 30 minutes  Signed:  Ranveer Wahlstrom, DO [facility] Pager: [phone] 12/17/2015, 11:29 AM

## 2015-12-17 NOTE — Progress Notes (Signed)
SW spoke with [facility]/ Ivin Booty who confirms that pt is welcomed to come to facility. SW made daughter aware who is on the way to [facility]. SW made nurse aware.Nurse Report Number: [id number]  Transportation has been scheduled for 2:00pm  Tilda Burrow, Social Worker [phone]

## 2015-12-18 ENCOUNTER — Non-Acute Institutional Stay (SKILLED_NURSING_FACILITY): Payer: Medicare Other | Admitting: Adult Health

## 2015-12-18 ENCOUNTER — Encounter: Payer: Self-pay | Admitting: Adult Health

## 2015-12-18 DIAGNOSIS — J410 Simple chronic bronchitis: Secondary | ICD-10-CM | POA: Diagnosis not present

## 2015-12-18 DIAGNOSIS — J810 Acute pulmonary edema: Secondary | ICD-10-CM

## 2015-12-18 DIAGNOSIS — N179 Acute kidney failure, unspecified: Secondary | ICD-10-CM | POA: Diagnosis not present

## 2015-12-18 DIAGNOSIS — I2510 Atherosclerotic heart disease of native coronary artery without angina pectoris: Secondary | ICD-10-CM

## 2015-12-18 DIAGNOSIS — K5901 Slow transit constipation: Secondary | ICD-10-CM | POA: Diagnosis not present

## 2015-12-18 DIAGNOSIS — R2681 Unsteadiness on feet: Secondary | ICD-10-CM

## 2015-12-18 DIAGNOSIS — S72001S Fracture of unspecified part of neck of right femur, sequela: Secondary | ICD-10-CM

## 2015-12-18 DIAGNOSIS — Z794 Long term (current) use of insulin: Secondary | ICD-10-CM

## 2015-12-18 DIAGNOSIS — E785 Hyperlipidemia, unspecified: Secondary | ICD-10-CM

## 2015-12-18 DIAGNOSIS — E1122 Type 2 diabetes mellitus with diabetic chronic kidney disease: Secondary | ICD-10-CM | POA: Diagnosis not present

## 2015-12-18 DIAGNOSIS — C50912 Malignant neoplasm of unspecified site of left female breast: Secondary | ICD-10-CM

## 2015-12-18 DIAGNOSIS — D62 Acute posthemorrhagic anemia: Secondary | ICD-10-CM

## 2015-12-18 DIAGNOSIS — N184 Chronic kidney disease, stage 4 (severe): Secondary | ICD-10-CM

## 2015-12-18 DIAGNOSIS — E876 Hypokalemia: Secondary | ICD-10-CM

## 2015-12-18 DIAGNOSIS — I10 Essential (primary) hypertension: Secondary | ICD-10-CM

## 2015-12-18 NOTE — Progress Notes (Signed)
Patient ID: [name], female   DOB: [birth year], 82 y.o.   MRN: [id number]    DATE:  12/18/2015   MRN:  [id number]  BIRTHDAY: 02/16/1926  Facility:  Nursing Home Location:  [place] Room Number: [id number]  LEVEL OF CARE:  SNF (31)  Contact Information    Name Relation Home Work Mobile   [name],[name] Daughter [phone]     Marisa Cyphers   [phone]       Code Status History    Date Active Date Inactive Code Status Order ID Comments User Context   12/09/2015 10:51 PM 12/17/2015  5:52 PM Full Code [id number]  Rod Can, MD Inpatient   12/09/2015 10:51 PM 12/09/2015 10:51 PM Full Code [id number]  Thurnell Lose, MD Inpatient       Chief Complaint  Patient presents with  . Hospitalization Follow-up    HISTORY OF PRESENT ILLNESS:  This is an 82 year old female who has been admitted to [name] on 12/17/15 from [facility]. She has PMH of chronic kidney disease stage IV, hypertension, diabetes mellitus, COPD, asthma and chronic respiratory failure on 2 L at HS. She is visiting from [place] and had a fall sustaining right distal femur fracture S/P ORIF. She developed pulmonary edema due to fluid resuscitation and was started on IV Lasix and now transitioned to PO Lasix.  She has been admitted for a short-term rehabilitation.   PAST MEDICAL HISTORY:  Past Medical History:  Diagnosis Date  . Arthritis   . Asthma   . Cancer [facility])    breast - left  . COPD (chronic obstructive pulmonary disease) ([place])   . Diabetes mellitus without complication ([place])    Type II  . Fall 12/2015  . Hypertension      CURRENT MEDICATIONS: Reviewed  Patient's Medications  New Prescriptions   No medications on file  Previous Medications   ANASTROZOLE (ARIMIDEX) 1 MG TABLET    Take 1 mg by mouth daily.    ATORVASTATIN (LIPITOR) 10 MG TABLET    Take 10 mg by mouth daily.   B COMPLEX VITAMINS TABLET    Take 1 tablet by mouth daily.   BUDESONIDE  (PULMICORT) 0.25 MG/2ML NEBULIZER SOLUTION    Take 0.25 mg by nebulization daily.    CLOPIDOGREL (PLAVIX) 75 MG TABLET    Take 75 mg by mouth daily.    FERROUS SULFATE (IRON) 325 (65 FE) MG TABS    Take 325 mg by mouth daily.   FORMOTEROL (PERFOROMIST) 20 MCG/2ML NEBULIZER SOLUTION    Take 20 mcg by nebulization daily.    FUROSEMIDE (LASIX) 40 MG TABLET    Take 40 mg by mouth daily.    GARLIC [id number] MG CAPS    Take 1,000 mg by mouth daily.   HYDROCODONE-ACETAMINOPHEN (NORCO/VICODIN) 5-325 MG TABLET    Take 1-2 tablets by mouth every 6 (six) hours as needed for moderate pain.   INSULIN GLARGINE (TOUJEO SOLOSTAR) 300 UNIT/ML SOPN    Inject 10 Units into the skin daily.    LABETALOL (NORMODYNE) 100 MG TABLET    Take 100 mg by mouth 2 (two) times daily.   MULTIPLE VITAMINS-MINERALS (OCUTABS) TABS    Take 1 tablet by mouth daily.   NIFEDIPINE (PROCARDIA-XL/ADALAT CC) 30 MG 24 HR TABLET    Take 30 mg by mouth 2 (two) times daily.    NITROGLYCERIN (NITROSTAT) 0.4 MG SL TABLET    Place 0.4 mg under the tongue every 5 (  five) minutes as needed for chest pain.   OMEGA-3 FATTY ACIDS (OMEGA-3 PLUS PO)    Take 1 capsule by mouth 2 (two) times daily.    OXYGEN    Inhale 2 L/min into the lungs as needed (To maintain O2 sats >92%).   POLYVINYL ALCOHOL-POVIDONE (REFRESH OP)    Place 1 drop into both eyes 2 (two) times daily.   POTASSIUM CHLORIDE SA (K-DUR,KLOR-CON) 20 MEQ TABLET    Take 20 mEq by mouth daily.   PREDNISOLONE ACETATE (PRED FORTE) 1 % OPHTHALMIC SUSPENSION    3 drops 2 (two) times daily.   SAXAGLIPTIN HCL (ONGLYZA) 2.5 MG TABS TABLET    Take 2.5 mg by mouth daily.    SENNA-DOCUSATE (SENOKOT-S) 8.6-50 MG TABLET    Take 1 tablet by mouth 2 (two) times daily.   TRAMADOL (ULTRAM) 50 MG TABLET    Take 1 tablet (50 mg total) by mouth every 6 (six) hours as needed for moderate pain.   VENTOLIN HFA 108 (90 BASE) MCG/ACT INHALER    Inhale 2 puffs into the lungs every 4 (four) hours as needed for wheezing or  shortness of breath.   Modified Medications   No medications on file  Discontinued Medications   LOVASTATIN (MEVACOR) 20 MG TABLET    Take 20 mg by mouth daily.      Allergies  Allergen Reactions  . Sulfa Antibiotics Other (See Comments)    Reaction unknown  . Tape Other (See Comments)    Blisters  . Penicillins Rash    Has patient had a PCN reaction causing immediate rash, facial/tongue/throat swelling, SOB or lightheadedness with hypotension: yes Has patient had a PCN reaction causing severe rash involving mucus membranes or skin necrosis: no Has patient had a PCN reaction that required hospitalization yes as a teenager Has patient had a PCN reaction occurring within the last 10 years: no was a teenager If all of the above answers are "NO", then may proceed with Cephalosporin use.      REVIEW OF SYSTEMS:  GENERAL: no change in appetite, no fatigue, no weight changes, no fever, chills or weakness EYES: Denies change in vision, dry eyes, eye pain, itching or discharge EARS: Denies change in hearing, ringing in ears, or earache NOSE: Denies nasal congestion or epistaxis MOUTH and THROAT: Denies oral discomfort, gingival pain or bleeding, pain from teeth or hoarseness   RESPIRATORY: no cough, SOB, DOE, wheezing, hemoptysis CARDIAC: no chest pain, edema or palpitations GI: no abdominal pain, diarrhea, constipation, heart burn, nausea or vomiting GU: Denies dysuria, frequency, hematuria, incontinence, or discharge PSYCHIATRIC: Denies feeling of depression or anxiety. No report of hallucinations, insomnia, paranoia, or agitation     PHYSICAL EXAMINATION  GENERAL APPEARANCE: Well nourished. In no acute distress. Normal body habitus SKIN:  Right femur surgical incision is covered with aquacel dressing, dry and no erythema HEAD: Normal in size and contour. No evidence of trauma EYES: Lids open and close normally. No blepharitis, entropion or ectropion. PERRL. Conjunctivae are  clear and sclerae are white. Lenses are without opacity EARS: Pinnae are normal. Patient hears normal voice tunes of the examiner MOUTH and THROAT: Lips are without lesions. Oral mucosa is moist and without lesions. Tongue is normal in shape, size, and color and without lesions NECK: supple, trachea midline, no neck masses, no thyroid tenderness, no thyromegaly LYMPHATICS: no LAN in the neck, no supraclavicular LAN RESPIRATORY: breathing is even & unlabored, BS CTAB CARDIAC: RRR, no murmur,no extra heart sounds, no  edema GI: abdomen soft, normal BS, no masses, no tenderness, no hepatomegaly, no splenomegaly EXTREMITIES:   Able to move 4 extremities PSYCHIATRIC: Alert and oriented X 3. Affect and behavior are appropriate  LABS/RADIOLOGY: Labs reviewed: Basic Metabolic Panel:  Recent Labs  12/12/15 0401 12/13/15 1016  12/15/15 0400 12/16/15 0550 12/17/15 0510  NA 136 138  < > 139 141 139  K 4.8 4.3  < > 3.7 3.3* 4.0  CL 107 109  < > 109 103 104  CO2 22 22  < > 24 27 27   GLUCOSE 149* 217*  < > 180* 183* 146*  BUN 51* 48*  < > 48* 54* 59*  CREATININE 2.61* 2.45*  < > 2.48* 2.79* 2.81*  CALCIUM 8.2* 8.4*  < > 9.0 9.3 8.9  PHOS 4.3 3.6  --   --   --   --   < > = values in this interval not displayed. Liver Function Tests:  Recent Labs  12/12/15 0401 12/13/15 1016 12/16/15 0550  AST  --   --  17  ALT  --   --  32  ALKPHOS  --   --  77  BILITOT  --   --  0.8  PROT  --   --  5.5*  ALBUMIN 2.3* 2.2* 2.6*    Recent Labs  12/09/15 0402  12/13/15 1016 12/14/15 1118 12/15/15 0400 12/16/15 0550  WBC 8.9  < > 4.2 5.1 4.8 5.9  NEUTROABS 7.3  --  2.8  --  2.9  --   HGB 11.8*  < > 7.9* 9.9* 9.5* 10.5*  HCT 36.3  < > 25.5* 31.3* 30.3* 32.5*  MCV 88.3  < > 93.4 91.0 90.7 92.1  PLT 150  < > 123* 148* 169 214  < > = values in this interval not displayed.  Cardiac Enzymes:  Recent Labs  12/12/15 0401 12/14/15 1436 12/14/15 1956 12/16/15 0550  CKTOTAL 253*  --   --   --    TROPONINI  --  0.16* 0.18* 0.08*   CBG:  Recent Labs  12/16/15 2206 12/17/15 0746 12/17/15 1156  GLUCAP 239* 273* 279*      Dg Chest 1 View  Result Date: 12/09/2015 CLINICAL DATA:  [place] tonight. History of COPD, hypertension, diabetes, LEFT breast cancer. EXAM: CHEST 1 VIEW COMPARISON:  Chest radiograph April 29, 2008 FINDINGS: Cardiomediastinal silhouette is normal for this apical lordotic technique. Calcified aortic knob. Large hiatal hernia. Hepatic eventration. No pneumothorax. Osteopenia. IMPRESSION: No acute cardiopulmonary process considering apical lordotic technique. Large hiatal hernia. Electronically Signed   By: Elon Alas M.D.   On: 12/09/2015 01:56   US Renal  Result Date: 12/12/2015 CLINICAL DATA:  Acute on chronic renal failure, diabetes mellitus, COPD, asthma, hypertension, former smoker EXAM: RENAL / URINARY TRACT ULTRASOUND COMPLETE COMPARISON:  None FINDINGS: Right Kidney: Length: 10.6 cm. Cortical thinning. Increased cortical echogenicity. Two small cysts identified, mid kidney 12 x 13 x 15 mm and peripelvic 23 x 21 x 18 mm. No definite solid mass or hydronephrosis. No shadowing calcifications. Left Kidney: Length: 9.3 cm. Cortical thinning. Increased cortical echogenicity. Tiny cyst upper pole 12 x 10 x 12 mm, minimally complicated with few internal echoes. Bladder: Well distended, normal appearance IMPRESSION: Medical renal disease changes of both kidneys. BILATERAL small renal cysts, with a minimally complicated 12 mm cyst upper pole LEFT kidney. Electronically Signed   By: Lavonia Dana M.D.   On: 12/12/2015 18:14   Ct Femur Right Wo  Contrast  Result Date: 12/09/2015 CLINICAL DATA:  Acute distal right femur fracture. EXAM: CT OF THE RIGHT FEMUR WITHOUT CONTRAST TECHNIQUE: Multidetector CT imaging was performed according to the standard protocol. Multiplanar CT image reconstructions were also generated. COMPARISON:  Radiographs dated 12/09/2015 FINDINGS: There  is a comminuted fracture of the distal femoral metaphysis just above the femoral component of the total knee prosthesis. There is a 15 mm of posterior displacement of the distal fragment. The fracture does not extend into the femoral condyles. The patella is intact. Proximal tibia and fibula are intact. No loosening of the prosthesis components. Small amount of hemorrhage into the adjacent soft tissues at the fracture site. The proximal femur appears normal. Minimal marginal osteophytes on the right acetabulum. IMPRESSION: Comminuted distal right femoral metaphysis with 15 mm of posterior displacement of the distal fragment. No extension into the femoral condyles. Electronically Signed   By: Lorriane Shire M.D.   On: 12/09/2015 08:27   Dg Chest Port 1 View  Result Date: 12/14/2015 CLINICAL DATA:  Tachypnea EXAM: PORTABLE CHEST 1 VIEW COMPARISON:  12/09/2015 chest radiograph. FINDINGS: Stable cardiomediastinal silhouette with mild cardiomegaly and aortic atherosclerosis. No pneumothorax. Probable trace bilateral pleural effusions. Mild pulmonary edema. Mild bibasilar atelectasis. IMPRESSION: 1. Mild congestive heart failure. 2. Probable trace bilateral pleural effusions and mild bibasilar atelectasis. 3. Aortic atherosclerosis. Electronically Signed   By: Ilona Sorrel M.D.   On: 12/14/2015 12:30   Dg Knee Complete 4 Views Left  Result Date: 12/09/2015 CLINICAL DATA:  Fall at home in her den onto carpeted floor. Bilateral knee pain. EXAM: LEFT KNEE - COMPLETE 4+ VIEW COMPARISON:  None. FINDINGS: No fracture or dislocation. The alignment is maintained. Mild medial tibial femoral joint space narrowing. No joint effusion. Small quadriceps enthesophyte. There is mild prepatellar soft tissue edema. Vascular calcifications are seen. IMPRESSION: No acute fracture or subluxation. Mild medial tibial femoral joint space narrowing. Electronically Signed   By: Jeb Levering M.D.   On: 12/09/2015 01:54   Dg Knee  Complete 4 Views Right  Result Date: 12/09/2015 CLINICAL DATA:  Fall at home in her den onto carpeted floor. Bilateral knee pain. EXAM: RIGHT KNEE - COMPLETE 4+ VIEW COMPARISON:  None. FINDINGS: Displaced angulated distal femur fracture just proximal to right knee arthroplasty. Approximately 2 cm of impaction. There is apex lateral angulation and subluxation of the distal fracture fragment. Fracture extends to the femoral component of the arthroplasty anteriorly. There is been patellar resurfacing. Tibial component of the arthroplasty is intact. There is a lipohemarthrosis. Vascular calcifications are seen. IMPRESSION: Displaced angulated periprosthetic fracture of the distal femur extending to the femoral component of the right knee arthroplasty. Lipohemarthrosis, small to moderate in size. Electronically Signed   By: Jeb Levering M.D.   On: 12/09/2015 01:57   Dg Abd Portable 1v  Result Date: 12/14/2015 CLINICAL DATA:  Constipation. EXAM: PORTABLE ABDOMEN - 1 VIEW COMPARISON:  None. FINDINGS: Normal bowel gas pattern. There is mild increased stool in the rectum. Right iliac artery vascular stent is noted. There are clips in the outer quadrant by prior cholecystectomy. Aortoiliac vascular calcifications are noted. Soft tissues are otherwise unremarkable. Bony structures are demineralized but grossly intact. IMPRESSION: 1. No acute findings.  No evidence of bowel obstruction. 2. Mild increased stool noted in the rectum. Electronically Signed   By: Lajean Manes M.D.   On: 12/14/2015 12:27   Dg C-arm 61-120 Min  Result Date: 12/09/2015 CLINICAL DATA:  ORIF for distal femur fracture. EXAM:  RIGHT FEMUR PORTABLE 1 VIEW; DG C-ARM 61-120 MIN COMPARISON:  Earlier the same day. FINDINGS: Image 1 shows the distal right femur fracture which is demonstrated and lateral projection on image 2. Image 3 shows a long cortical plate and screw fixation device with 3 or 4 metaphyseal screws. Image 4 shows the proximal end  of the cortical plate. Images 5 and 6 shows a plate and lateral projection. IMPRESSION: Status post ORIF for distal femur fracture this patient status post total knee replacement. No evidence for immediate hardware complications. Electronically Signed   By: Misty Stanley M.D.   On: 12/09/2015 20:22   Dg Femur [place], [place] 2 Views Right  Result Date: 12/10/2015 CLINICAL DATA:  Postoperative radiographs, status post right femoral plate placement. EXAM: RIGHT FEMUR PORTABLE 1 VIEW COMPARISON:  Intraoperative radiographs performed earlier today at 6:40 p.m. FINDINGS: There has been placement of a plate and screws along the right femur, transfixing the distal femoral fracture in near anatomic alignment. A total knee arthroplasty is grossly unremarkable in appearance. No new fractures are identified. The right femoral head remains seated at the acetabulum. Postoperative soft tissue changes are seen. Scattered vascular calcifications are seen. IMPRESSION: Status post internal fixation of distal femoral fracture in near anatomic alignment. Total knee arthroplasty is grossly unremarkable in appearance. Electronically Signed   By: Garald Balding M.D.   On: 12/10/2015 00:02   Dg Femur Port, Min 2 Views Right  Result Date: 12/09/2015 CLINICAL DATA:  ORIF for distal femur fracture. EXAM: RIGHT FEMUR PORTABLE 1 VIEW; DG C-ARM 61-120 MIN COMPARISON:  Earlier the same day. FINDINGS: Image 1 shows the distal right femur fracture which is demonstrated and lateral projection on image 2. Image 3 shows a long cortical plate and screw fixation device with 3 or 4 metaphyseal screws. Image 4 shows the proximal end of the cortical plate. Images 5 and 6 shows a plate and lateral projection. IMPRESSION: Status post ORIF for distal femur fracture this patient status post total knee replacement. No evidence for immediate hardware complications. Electronically Signed   By: Misty Stanley M.D.   On: 12/09/2015 20:22     ASSESSMENT/PLAN:  Unsteady gait - for rehabilitation, PT and OT; fall precaution  Right femur fracture S/P ORIF - for rehabilitation, PT and OT; continue Norco 5/325 mg 1-2 tabs by mouth every 6 hours when necessary and tramadol 50 mg 1 tab by mouth every 6 hours when necessary for pain; Lovenox 30 mg SQ daily till 01/08/16 for DVT prophylaxis; follow-up with orthopedic consult with Dr. Rod Can in 2 weeks  Pulmonary edema -  continue Lasix 40 mg daily; for BMP in one week  Acute on chronic renal failure stage IV - check BMP in 1 week Lab Results  Component Value Date   CREATININE 2.81 (H) 12/17/2015   Hypertension - continue labetalol 100 mg 1 tab by mouth twice a day, nifedipine ER 30 mg 1 tab by mouth twice a day  Constipation - continue senna S 1 tab by mouth twice a day  Hyperlipidemia - continue omega-3 Fish oil 1 capsule twice a day and Lipitor 10 mg 1 tab by mouth daily ; check lipid panel  Acute blood loss anemia - continue iron 325 mg 1 tab by mouth daily; check CBC Lab Results  Component Value Date   HGB 10.5 (L) 12/16/2015    Diabetes mellitus, type II with renal complications - continue Toujeo 300 units/mL 10 units subcutaneous daily, Onglyza 2.5 mg 1 tab  by mouth daily Lab Results  Component Value Date   HGBA1C 6.3 (H) 12/09/2015    Left breast cancer - continue anastrozole 1 mg 1 tab by mouth daily  CAD S/P stent placement in 2009 - stable; continue Plavix 75 mg 1 tab by mouth daily and NTG PRN  COPD - no SOB; continue Pulmicort 0.25 mg/2 mL take 0.25 mg via nebulization daily, Performist 20 mcg/2 ml via nebulization daily, Ventolin HFA 90 mcg inhale 2 puffs into lungs Q 4 hours PRN  Hypokalemia - continue Klor-Con 20 meq daily Lab Results  Component Value Date   K 4.0 12/17/2015       Goals of care:  Short-term rehabilitation     Durenda Age, NP [facility] [place] [phone]

## 2015-12-19 ENCOUNTER — Non-Acute Institutional Stay (SKILLED_NURSING_FACILITY): Payer: Medicare Other | Admitting: Internal Medicine

## 2015-12-19 ENCOUNTER — Encounter: Payer: Self-pay | Admitting: Internal Medicine

## 2015-12-19 DIAGNOSIS — I2510 Atherosclerotic heart disease of native coronary artery without angina pectoris: Secondary | ICD-10-CM

## 2015-12-19 DIAGNOSIS — D62 Acute posthemorrhagic anemia: Secondary | ICD-10-CM

## 2015-12-19 DIAGNOSIS — I509 Heart failure, unspecified: Secondary | ICD-10-CM

## 2015-12-19 DIAGNOSIS — K5900 Constipation, unspecified: Secondary | ICD-10-CM

## 2015-12-19 DIAGNOSIS — R531 Weakness: Secondary | ICD-10-CM

## 2015-12-19 DIAGNOSIS — J9611 Chronic respiratory failure with hypoxia: Secondary | ICD-10-CM | POA: Diagnosis not present

## 2015-12-19 DIAGNOSIS — Z853 Personal history of malignant neoplasm of breast: Secondary | ICD-10-CM

## 2015-12-19 DIAGNOSIS — E119 Type 2 diabetes mellitus without complications: Secondary | ICD-10-CM

## 2015-12-19 DIAGNOSIS — S72001S Fracture of unspecified part of neck of right femur, sequela: Secondary | ICD-10-CM

## 2015-12-19 DIAGNOSIS — N184 Chronic kidney disease, stage 4 (severe): Secondary | ICD-10-CM | POA: Diagnosis not present

## 2015-12-19 LAB — LIPID PANEL
Cholesterol: 116 mg/dL (ref 0–200)
HDL: 31 mg/dL — AB (ref 35–70)
LDL Cholesterol: 63 mg/dL
TRIGLYCERIDES: 113 mg/dL (ref 40–160)

## 2015-12-19 LAB — CBC AND DIFFERENTIAL
HEMATOCRIT: 30 % — AB (ref 36–46)
Hemoglobin: 9.5 g/dL — AB (ref 12.0–16.0)
NEUTROS ABS: 4 /uL
PLATELETS: 238 10*3/uL (ref 150–399)
WBC: 5.6 10*3/mL

## 2015-12-19 NOTE — Progress Notes (Signed)
LOCATION: [place]  PCP: Pcp Not In System   Code Status: Full Code  Goals of care: Advanced Directive information Advanced Directives 12/09/2015  Does patient have an advance directive? Yes       Extended Emergency Contact Information Primary Emergency Contact: Meryle Ready of [place] Phone: [phone] Relation: Daughter Secondary Emergency Contact: Foye Spurling [place] of [place] Mobile Phone: [phone] Relation: Son   Allergies  Allergen Reactions  . Sulfa Antibiotics Other (See Comments)    Reaction unknown  . Tape Other (See Comments)    Blisters  . Penicillins Rash    Has patient had a PCN reaction causing immediate rash, facial/tongue/throat swelling, SOB or lightheadedness with hypotension: yes Has patient had a PCN reaction causing severe rash involving mucus membranes or skin necrosis: no Has patient had a PCN reaction that required hospitalization yes as a teenager Has patient had a PCN reaction occurring within the last 10 years: no was a teenager If all of the above answers are "NO", then may proceed with Cephalosporin use.     Chief Complaint  Patient presents with  . New Admit To SNF    New Admission     HPI:  Patient is a 82 y.o. female seen today for short term rehabilitation post hospital admission from 12/08/15-12/17/15 post fall with right femoral neck fracture. She underwent ORIF. She then had acute on chronic respiratory failure with CHF exacerbation and required diuresis. She is seen in her room today.   Review of Systems:  Constitutional: Negative for fever, chills, diaphoresis. Energy level is slowly coming back.  HENT: Negative for headache, congestion, difficulty swallowing. Positive for occasional nasal discharge.   Eyes: Negative for blurred vision, double vision and discharge.  Respiratory: Negative for shortness of breath and wheezing. Positive for cough with white phlegm. Uses 2 liters of oxygen at  home at night.   Cardiovascular: Negative for chest pain, palpitations, leg swelling.  Gastrointestinal: Negative for heartburn, nausea, vomiting, abdominal pain. Last bowel movement was today.  Genitourinary: Negative for dysuria and flank pain.  Musculoskeletal: Negative for back pain, fall in the facility.  Skin: Negative for itching, rash.  Neurological: Negative for dizziness. Psychiatric/Behavioral: Negative for depression.   Past Medical History:  Diagnosis Date  . Arthritis   . Asthma   . Cancer [facility])    breast - left  . COPD (chronic obstructive pulmonary disease) ([place])   . Diabetes mellitus without complication ([place])    Type II  . Fall 12/2015  . Hypertension    Past Surgical History:  Procedure Laterality Date  . ABDOMINAL HYSTERECTOMY  1975  . APPENDECTOMY  1947  . BREAST LUMPECTOMY Left 2015  . FEMORAL ARTERY STENT     x2  . REPLACEMENT TOTAL KNEE Right   . STENT PLACEMENT VASCULAR ([place] [facility])     2 in chest  . TONSILLECTOMY  1934  . TOTAL KNEE ARTHROPLASTY Right 12/09/2015   Procedure: ORIF OF DISTAL FEMUR;  Surgeon: Rod Can, MD;  Location: [place];  Service: Orthopedics;  Laterality: Right;   Social History:   reports that she quit smoking about 17 years ago. Her smoking use included Cigarettes. She has never used smokeless tobacco. She reports that she does not drink alcohol or use drugs.  No family history on file.  Medications:   Medication List       Accurate as of 12/19/15 11:24 AM. Always use your most recent med list.  anastrozole 1 MG tablet Commonly known as:  ARIMIDEX Take 1 mg by mouth daily.   atorvastatin 10 MG tablet Commonly known as:  LIPITOR Take 10 mg by mouth daily.   b complex vitamins tablet Take 1 tablet by mouth daily.   budesonide 0.25 MG/2ML nebulizer solution Commonly known as:  PULMICORT Take 0.25 mg by nebulization daily.   clopidogrel 75 MG tablet Commonly known as:  PLAVIX Take 75 mg by mouth  daily.   enoxaparin 30 MG/0.3ML injection Commonly known as:  LOVENOX Inject 30 mg into the skin daily. Stop date 01/08/16   furosemide 40 MG tablet Commonly known as:  LASIX Take 40 mg by mouth daily.   Garlic 123XX123 MG Caps Take 1,000 mg by mouth daily.   HYDROcodone-acetaminophen 5-325 MG tablet Commonly known as:  NORCO/VICODIN Take 1-2 tablets by mouth every 6 (six) hours as needed for moderate pain.   Iron 325 (65 Fe) MG Tabs Take 325 mg by mouth daily.   labetalol 100 MG tablet Commonly known as:  NORMODYNE Take 100 mg by mouth 2 (two) times daily.   NIFEdipine 30 MG 24 hr tablet Commonly known as:  PROCARDIA-XL/ADALAT CC Take 30 mg by mouth 2 (two) times daily.   nitroGLYCERIN 0.4 MG SL tablet Commonly known as:  NITROSTAT Place 0.4 mg under the tongue every 5 (five) minutes as needed for chest pain.   OCUTABS Tabs Take 1 tablet by mouth daily.   OMEGA-3 PLUS PO Take 1 capsule by mouth 2 (two) times daily.   OXYGEN Inhale 2 L/min into the lungs as needed (To maintain O2 sats >92%).   PERFOROMIST 20 MCG/2ML nebulizer solution Generic drug:  formoterol Take 20 mcg by nebulization daily.   potassium chloride SA 20 MEQ tablet Commonly known as:  K-DUR,KLOR-CON Take 20 mEq by mouth daily.   prednisoLONE acetate 1 % ophthalmic suspension Commonly known as:  PRED FORTE 3 drops 2 (two) times daily.   REFRESH OP Place 1 drop into both eyes 2 (two) times daily.   saxagliptin HCl 2.5 MG Tabs tablet Commonly known as:  ONGLYZA Take 2.5 mg by mouth daily.   senna-docusate 8.6-50 MG tablet Commonly known as:  Senokot-S Take 1 tablet by mouth 2 (two) times daily.   TOUJEO SOLOSTAR 300 UNIT/ML Sopn Generic drug:  Insulin Glargine Inject 10 Units into the skin daily.   traMADol 50 MG tablet Commonly known as:  ULTRAM Take 1 tablet (50 mg total) by mouth every 6 (six) hours as needed for moderate pain.   VENTOLIN HFA 108 (90 Base) MCG/ACT inhaler Generic  drug:  albuterol Inhale 2 puffs into the lungs every 4 (four) hours as needed for wheezing or shortness of breath.       Immunizations:  There is no immunization history on file for this patient.   Physical Exam:  Vitals:   12/19/15 1118  BP: (!) 119/59  Pulse: (!) 59  Resp: 18  Temp: 97.4 F (36.3 C)  TempSrc: Oral  SpO2: 95%  Weight: 174 lb (78.9 kg)  Height: 5\' 4"  (1.626 m)   Body mass index is 29.87 kg/m.  General- elderly female, overweight, in no acute distress Head- normocephalic, atraumatic Nose- no nasal discharge Throat- moist mucus membrane Eyes- PERRLA, EOMI, no pallor, no icterus, no discharge, normal conjunctiva, normal sclera Neck- no cervical lymphadenopathy Cardiovascular- normal s1,s2, no murmur, trace leg edema Respiratory- bilateral clear to auscultation, no wheeze, no rhonchi, no crackles, no use of accessory muscles, on  2 l o2 [place] Abdomen- bowel sounds present, soft, non tender Musculoskeletal- able to move all 4 extremities, limited right leg range of motion Neurological- alert and oriented to person, place and time Skin- warm and dry, right leg surgical incision with aquacell dressing in place Psychiatry- normal mood and affect    Labs reviewed: Basic Metabolic Panel:  Recent Labs  12/12/15 0401 12/13/15 1016  12/15/15 0400 12/16/15 0550 12/17/15 0510  NA 136 138  < > 139 141 139  K 4.8 4.3  < > 3.7 3.3* 4.0  CL 107 109  < > 109 103 104  CO2 22 22  < > 24 27 27   GLUCOSE 149* 217*  < > 180* 183* 146*  BUN 51* 48*  < > 48* 54* 59*  CREATININE 2.61* 2.45*  < > 2.48* 2.79* 2.81*  CALCIUM 8.2* 8.4*  < > 9.0 9.3 8.9  PHOS 4.3 3.6  --   --   --   --   < > = values in this interval not displayed. Liver Function Tests:  Recent Labs  12/12/15 0401 12/13/15 1016 12/16/15 0550  AST  --   --  17  ALT  --   --  32  ALKPHOS  --   --  77  BILITOT  --   --  0.8  PROT  --   --  5.5*  ALBUMIN 2.3* 2.2* 2.6*   No results for input(s):  LIPASE, AMYLASE in the last 8760 hours. No results for input(s): AMMONIA in the last 8760 hours. CBC:  Recent Labs  12/09/15 0402  12/13/15 1016 12/14/15 1118 12/15/15 0400 12/16/15 0550  WBC 8.9  < > 4.2 5.1 4.8 5.9  NEUTROABS 7.3  --  2.8  --  2.9  --   HGB 11.8*  < > 7.9* 9.9* 9.5* 10.5*  HCT 36.3  < > 25.5* 31.3* 30.3* 32.5*  MCV 88.3  < > 93.4 91.0 90.7 92.1  PLT 150  < > 123* 148* 169 214  < > = values in this interval not displayed. Cardiac Enzymes:  Recent Labs  12/12/15 0401 12/14/15 1436 12/14/15 1956 12/16/15 0550  CKTOTAL 253*  --   --   --   TROPONINI  --  0.16* 0.18* 0.08*   BNP: Invalid input(s): POCBNP CBG:  Recent Labs  12/16/15 2206 12/17/15 0746 12/17/15 1156  GLUCAP 239* 273* 279*    Radiological Exams: Dg Chest 1 View  Result Date: 12/09/2015 CLINICAL DATA:  [place] tonight. History of COPD, hypertension, diabetes, LEFT breast cancer. EXAM: CHEST 1 VIEW COMPARISON:  Chest radiograph April 29, 2008 FINDINGS: Cardiomediastinal silhouette is normal for this apical lordotic technique. Calcified aortic knob. Large hiatal hernia. Hepatic eventration. No pneumothorax. Osteopenia. IMPRESSION: No acute cardiopulmonary process considering apical lordotic technique. Large hiatal hernia. Electronically Signed   By: Elon Alas M.D.   On: 12/09/2015 01:56   US Renal  Result Date: 12/12/2015 CLINICAL DATA:  Acute on chronic renal failure, diabetes mellitus, COPD, asthma, hypertension, former smoker EXAM: RENAL / URINARY TRACT ULTRASOUND COMPLETE COMPARISON:  None FINDINGS: Right Kidney: Length: 10.6 cm. Cortical thinning. Increased cortical echogenicity. Two small cysts identified, mid kidney 12 x 13 x 15 mm and peripelvic 23 x 21 x 18 mm. No definite solid mass or hydronephrosis. No shadowing calcifications. Left Kidney: Length: 9.3 cm. Cortical thinning. Increased cortical echogenicity. Tiny cyst upper pole 12 x 10 x 12 mm, minimally complicated with few  internal echoes. Bladder: Well distended,  normal appearance IMPRESSION: Medical renal disease changes of both kidneys. BILATERAL small renal cysts, with a minimally complicated 12 mm cyst upper pole LEFT kidney. Electronically Signed   By: Lavonia Dana M.D.   On: 12/12/2015 18:14   Ct Femur Right Wo Contrast  Result Date: 12/09/2015 CLINICAL DATA:  Acute distal right femur fracture. EXAM: CT OF THE RIGHT FEMUR WITHOUT CONTRAST TECHNIQUE: Multidetector CT imaging was performed according to the standard protocol. Multiplanar CT image reconstructions were also generated. COMPARISON:  Radiographs dated 12/09/2015 FINDINGS: There is a comminuted fracture of the distal femoral metaphysis just above the femoral component of the total knee prosthesis. There is a 15 mm of posterior displacement of the distal fragment. The fracture does not extend into the femoral condyles. The patella is intact. Proximal tibia and fibula are intact. No loosening of the prosthesis components. Small amount of hemorrhage into the adjacent soft tissues at the fracture site. The proximal femur appears normal. Minimal marginal osteophytes on the right acetabulum. IMPRESSION: Comminuted distal right femoral metaphysis with 15 mm of posterior displacement of the distal fragment. No extension into the femoral condyles. Electronically Signed   By: Lorriane Shire M.D.   On: 12/09/2015 08:27   Dg Chest Port 1 View  Result Date: 12/14/2015 CLINICAL DATA:  Tachypnea EXAM: PORTABLE CHEST 1 VIEW COMPARISON:  12/09/2015 chest radiograph. FINDINGS: Stable cardiomediastinal silhouette with mild cardiomegaly and aortic atherosclerosis. No pneumothorax. Probable trace bilateral pleural effusions. Mild pulmonary edema. Mild bibasilar atelectasis. IMPRESSION: 1. Mild congestive heart failure. 2. Probable trace bilateral pleural effusions and mild bibasilar atelectasis. 3. Aortic atherosclerosis. Electronically Signed   By: Ilona Sorrel M.D.   On: 12/14/2015  12:30   Dg Knee Complete 4 Views Left  Result Date: 12/09/2015 CLINICAL DATA:  Fall at home in her den onto carpeted floor. Bilateral knee pain. EXAM: LEFT KNEE - COMPLETE 4+ VIEW COMPARISON:  None. FINDINGS: No fracture or dislocation. The alignment is maintained. Mild medial tibial femoral joint space narrowing. No joint effusion. Small quadriceps enthesophyte. There is mild prepatellar soft tissue edema. Vascular calcifications are seen. IMPRESSION: No acute fracture or subluxation. Mild medial tibial femoral joint space narrowing. Electronically Signed   By: Jeb Levering M.D.   On: 12/09/2015 01:54   Dg Knee Complete 4 Views Right  Result Date: 12/09/2015 CLINICAL DATA:  Fall at home in her den onto carpeted floor. Bilateral knee pain. EXAM: RIGHT KNEE - COMPLETE 4+ VIEW COMPARISON:  None. FINDINGS: Displaced angulated distal femur fracture just proximal to right knee arthroplasty. Approximately 2 cm of impaction. There is apex lateral angulation and subluxation of the distal fracture fragment. Fracture extends to the femoral component of the arthroplasty anteriorly. There is been patellar resurfacing. Tibial component of the arthroplasty is intact. There is a lipohemarthrosis. Vascular calcifications are seen. IMPRESSION: Displaced angulated periprosthetic fracture of the distal femur extending to the femoral component of the right knee arthroplasty. Lipohemarthrosis, small to moderate in size. Electronically Signed   By: Jeb Levering M.D.   On: 12/09/2015 01:57   Dg Abd Portable 1v  Result Date: 12/14/2015 CLINICAL DATA:  Constipation. EXAM: PORTABLE ABDOMEN - 1 VIEW COMPARISON:  None. FINDINGS: Normal bowel gas pattern. There is mild increased stool in the rectum. Right iliac artery vascular stent is noted. There are clips in the outer quadrant by prior cholecystectomy. Aortoiliac vascular calcifications are noted. Soft tissues are otherwise unremarkable. Bony structures are demineralized  but grossly intact. IMPRESSION: 1. No acute findings.  No  evidence of bowel obstruction. 2. Mild increased stool noted in the rectum. Electronically Signed   By: Lajean Manes M.D.   On: 12/14/2015 12:27   Dg C-arm 61-120 Min  Result Date: 12/09/2015 CLINICAL DATA:  ORIF for distal femur fracture. EXAM: RIGHT FEMUR PORTABLE 1 VIEW; DG C-ARM 61-120 MIN COMPARISON:  Earlier the same day. FINDINGS: Image 1 shows the distal right femur fracture which is demonstrated and lateral projection on image 2. Image 3 shows a long cortical plate and screw fixation device with 3 or 4 metaphyseal screws. Image 4 shows the proximal end of the cortical plate. Images 5 and 6 shows a plate and lateral projection. IMPRESSION: Status post ORIF for distal femur fracture this patient status post total knee replacement. No evidence for immediate hardware complications. Electronically Signed   By: Misty Stanley M.D.   On: 12/09/2015 20:22   Dg Femur [place], [place] 2 Views Right  Result Date: 12/10/2015 CLINICAL DATA:  Postoperative radiographs, status post right femoral plate placement. EXAM: RIGHT FEMUR PORTABLE 1 VIEW COMPARISON:  Intraoperative radiographs performed earlier today at 6:40 p.m. FINDINGS: There has been placement of a plate and screws along the right femur, transfixing the distal femoral fracture in near anatomic alignment. A total knee arthroplasty is grossly unremarkable in appearance. No new fractures are identified. The right femoral head remains seated at the acetabulum. Postoperative soft tissue changes are seen. Scattered vascular calcifications are seen. IMPRESSION: Status post internal fixation of distal femoral fracture in near anatomic alignment. Total knee arthroplasty is grossly unremarkable in appearance. Electronically Signed   By: Garald Balding M.D.   On: 12/10/2015 00:02   Dg Femur Port, Min 2 Views Right  Result Date: 12/09/2015 CLINICAL DATA:  ORIF for distal femur fracture. EXAM: RIGHT FEMUR PORTABLE  1 VIEW; DG C-ARM 61-120 MIN COMPARISON:  Earlier the same day. FINDINGS: Image 1 shows the distal right femur fracture which is demonstrated and lateral projection on image 2. Image 3 shows a long cortical plate and screw fixation device with 3 or 4 metaphyseal screws. Image 4 shows the proximal end of the cortical plate. Images 5 and 6 shows a plate and lateral projection. IMPRESSION: Status post ORIF for distal femur fracture this patient status post total knee replacement. No evidence for immediate hardware complications. Electronically Signed   By: Misty Stanley M.D.   On: 12/09/2015 20:22    Assessment/Plan  Generalized weakness Will have patient work with PT/OT as tolerated to regain strength and restore function.  Fall precautions are in place.  Right femoral neck fracture S/p ORIF. Has orthopedic follow up. Will have her work with physical therapy and occupational therapy team to help with gait training and muscle strengthening exercises.fall precautions. Skin care. Encourage to be out of bed. Continue norco 5-325 mg 1-2 tab q6h prn pain and tramadol 50 mg q6h prn pain. Continue lovenox daily for DVT prophylaxis.   Blood loss anemia Post op, s/p 2 u prbc transfusion. Monitor cbc. Continue ferrous sulfate 325 mg daily  CHF Continue lasix 40 gm daily, labetalol 100 mg bid, nifedipine 30 mg bid. Continue kcl supplement. Monitor weight.  Chronic respiratory failure Continue o2 2l by nasal canula for now and wean off as tolerated to keep o2 sat > 90%. Continue formoterol nebulizer and pumicort with ventolin inhaler  Dm type 2 Lab Results  Component Value Date   HGBA1C 6.3 (H) 12/09/2015   Continue onglyza 2.5 mg daily and toujeo 10 u daily  Ckd  stage 4 Monitor bmp  CAD Chest pain free. Continue prn NTG with plavix, statin, labetalol, nifedipine and monitor  Constipation continue senna S bid  Left breast cancer continue anastrozole 1 mg daily   Goals of care: short term  rehabilitation   Labs/tests ordered: cbc bmp  Family/ staff Communication: reviewed care plan with patient and nursing supervisor    Blanchie Serve, MD Internal Medicine [place], [place] [address] Cell Phone (Monday-Friday 8 am - 5 pm): [phone] On Call: [phone] and follow prompts after 5 pm and on weekends Office Phone: [phone] Office Fax: [phone]

## 2015-12-24 ENCOUNTER — Non-Acute Institutional Stay (SKILLED_NURSING_FACILITY): Payer: Medicare Other | Admitting: Adult Health

## 2015-12-24 ENCOUNTER — Encounter: Payer: Self-pay | Admitting: Adult Health

## 2015-12-24 DIAGNOSIS — Z794 Long term (current) use of insulin: Secondary | ICD-10-CM

## 2015-12-24 DIAGNOSIS — E1122 Type 2 diabetes mellitus with diabetic chronic kidney disease: Secondary | ICD-10-CM | POA: Diagnosis not present

## 2015-12-24 DIAGNOSIS — N184 Chronic kidney disease, stage 4 (severe): Secondary | ICD-10-CM | POA: Diagnosis not present

## 2015-12-24 NOTE — Progress Notes (Signed)
This encounter was created in error - please disregard.  This encounter was created in error - please disregard.

## 2015-12-24 NOTE — Progress Notes (Signed)
Patient ID: [name], female   DOB: 09-24-1925, 82 y.o.   MRN: [id number]    DATE:    12/24/15  MRN:  [id number]  BIRTHDAY: 05/22/1925  Facility:  Nursing Home Location:  [place] Room Number: [id number]  LEVEL OF CARE:  SNF (31)  Contact Information    Name Relation Home Work Mobile   [name],[name] Daughter [phone]     Marisa Cyphers   [phone]   Thornell Mule [phone]         Code Status History    Date Active Date Inactive Code Status Order ID Comments User Context   12/09/2015 10:51 PM 12/17/2015  5:52 PM Full Code [id number]  Rod Can, MD Inpatient   12/09/2015 10:51 PM 12/09/2015 10:51 PM Full Code [id number]  Thurnell Lose, MD Inpatient       Chief Complaint  Patient presents with  . Acute Visit    Hyperglycemia    HISTORY OF PRESENT ILLNESS:  This is an 82 year old female who complained that her blood sugar has been high and that she does not go as high as 200s @ home. CBGs [phone].  She has been admitted to [facility] on 12/17/15 from [facility]. She has PMH of chronic kidney disease stage IV, hypertension, diabetes mellitus, COPD, asthma and chronic respiratory failure on 2 L at HS. She is visiting from [place] and had a fall sustaining right distal femur fracture S/P ORIF. She developed pulmonary edema due to fluid resuscitation and was started on IV Lasix and now transitioned to PO Lasix.  She is currently having a short-term rehabilitation.   PAST MEDICAL HISTORY:  Past Medical History:  Diagnosis Date  . Arthritis   . Asthma   . Cancer [facility])    breast - left  . COPD (chronic obstructive pulmonary disease) ([place])   . Diabetes mellitus without complication ([place])    Type II  . Fall 12/2015  . Hypertension      CURRENT MEDICATIONS: Reviewed  Patient's Medications  New Prescriptions   No medications on file  Previous Medications   ANASTROZOLE (ARIMIDEX) 1 MG TABLET     Take 1 mg by mouth daily.    ATORVASTATIN (LIPITOR) 10 MG TABLET    Take 10 mg by mouth daily.   B COMPLEX VITAMINS TABLET    Take 1 tablet by mouth daily.   BUDESONIDE (PULMICORT) 0.25 MG/2ML NEBULIZER SOLUTION    Take 0.25 mg by nebulization daily.    CLOPIDOGREL (PLAVIX) 75 MG TABLET    Take 75 mg by mouth daily.    ENOXAPARIN (LOVENOX) 30 MG/0.3ML INJECTION    Inject 30 mg into the skin daily. Stop date 01/08/16   FERROUS SULFATE (IRON) 325 (65 FE) MG TABS    Take 325 mg by mouth daily.   FORMOTEROL (PERFOROMIST) 20 MCG/2ML NEBULIZER SOLUTION    Take 20 mcg by nebulization daily.    FUROSEMIDE (LASIX) 40 MG TABLET    Take 40 mg by mouth daily.    GARLIC [id number] MG CAPS    Take 1,000 mg by mouth daily.   HYDROCODONE-ACETAMINOPHEN (NORCO/VICODIN) 5-325 MG TABLET    Take 1-2 tablets by mouth every 6 (six) hours as needed for moderate pain.   INSULIN GLARGINE (TOUJEO SOLOSTAR) 300 UNIT/ML SOPN    Inject 13 Units into the skin daily.    LABETALOL (NORMODYNE) 100 MG TABLET    Take 100 mg by mouth 2 (two) times daily.  MULTIPLE VITAMINS-MINERALS (OCUTABS) TABS    Take 1 tablet by mouth daily.   NIFEDIPINE (PROCARDIA-XL/ADALAT CC) 30 MG 24 HR TABLET    Take 30 mg by mouth 2 (two) times daily.    NITROGLYCERIN (NITROSTAT) 0.4 MG SL TABLET    Place 0.4 mg under the tongue every 5 (five) minutes as needed for chest pain.   OMEGA-3 FATTY ACIDS (OMEGA-3 PLUS PO)    Take 1 capsule by mouth 2 (two) times daily.    OXYGEN    Inhale 2 L/min into the lungs as needed (To maintain O2 sats >92%).   POLYVINYL ALCOHOL-POVIDONE (REFRESH OP)    Place 1 drop into both eyes 2 (two) times daily.   POTASSIUM CHLORIDE SA (K-DUR,KLOR-CON) 20 MEQ TABLET    Take 20 mEq by mouth daily.   PREDNISOLONE ACETATE (PRED FORTE) 1 % OPHTHALMIC SUSPENSION    3 drops 2 (two) times daily.   SAXAGLIPTIN HCL (ONGLYZA) 2.5 MG TABS TABLET    Take 2.5 mg by mouth daily.    SENNA-DOCUSATE (SENOKOT-S) 8.6-50 MG TABLET    Take 1 tablet by mouth 2  (two) times daily.   TRAMADOL (ULTRAM) 50 MG TABLET    Take 1 tablet (50 mg total) by mouth every 6 (six) hours as needed for moderate pain.   VENTOLIN HFA 108 (90 BASE) MCG/ACT INHALER    Inhale 2 puffs into the lungs every 4 (four) hours as needed for wheezing or shortness of breath.   Modified Medications   No medications on file  Discontinued Medications   No medications on file     Allergies  Allergen Reactions  . Sulfa Antibiotics Other (See Comments)    Reaction unknown  . Tape Other (See Comments)    Blisters  . Penicillins Rash    Has patient had a PCN reaction causing immediate rash, facial/tongue/throat swelling, SOB or lightheadedness with hypotension: yes Has patient had a PCN reaction causing severe rash involving mucus membranes or skin necrosis: no Has patient had a PCN reaction that required hospitalization yes as a teenager Has patient had a PCN reaction occurring within the last 10 years: no was a teenager If all of the above answers are "NO", then may proceed with Cephalosporin use.      REVIEW OF SYSTEMS:  GENERAL: no change in appetite, no fatigue, no weight changes, no fever, chills or weakness EYES: Denies change in vision, dry eyes, eye pain, itching or discharge EARS: Denies change in hearing, ringing in ears, or earache NOSE: Denies nasal congestion or epistaxis MOUTH and THROAT: Denies oral discomfort, gingival pain or bleeding, pain from teeth or hoarseness   RESPIRATORY: no cough, SOB, DOE, wheezing, hemoptysis CARDIAC: no chest pain, edema or palpitations GI: no abdominal pain, diarrhea, constipation, heart burn, nausea or vomiting GU: Denies dysuria, frequency, hematuria, incontinence, or discharge PSYCHIATRIC: Denies feeling of depression or anxiety. No report of hallucinations, insomnia, paranoia, or agitation     PHYSICAL EXAMINATION  GENERAL APPEARANCE: Well nourished. In no acute distress. Normal body habitus SKIN:  Right femur  surgical incision is covered with aquacel dressing, dry and no erythema HEAD: Normal in size and contour. No evidence of trauma EYES: Lids open and close normally. No blepharitis, entropion or ectropion. PERRL. Conjunctivae are clear and sclerae are white. Lenses are without opacity EARS: Pinnae are normal. Patient hears normal voice tunes of the examiner MOUTH and THROAT: Lips are without lesions. Oral mucosa is moist and without lesions. Tongue is normal in  shape, size, and color and without lesions NECK: supple, trachea midline, no neck masses, no thyroid tenderness, no thyromegaly LYMPHATICS: no LAN in the neck, no supraclavicular LAN RESPIRATORY: breathing is even & unlabored, BS CTAB CARDIAC: RRR, no murmur,no extra heart sounds, no edema GI: abdomen soft, normal BS, no masses, no tenderness, no hepatomegaly, no splenomegaly EXTREMITIES:   Able to move 4 extremities PSYCHIATRIC: Alert and oriented X 3. Affect and behavior are appropriate  LABS/RADIOLOGY: Labs reviewed: Basic Metabolic Panel:  Recent Labs  12/12/15 0401 12/13/15 1016  12/15/15 0400 12/16/15 0550 12/17/15 0510  NA 136 138  < > 139 141 139  K 4.8 4.3  < > 3.7 3.3* 4.0  CL 107 109  < > 109 103 104  CO2 22 22  < > 24 27 27   GLUCOSE 149* 217*  < > 180* 183* 146*  BUN 51* 48*  < > 48* 54* 59*  CREATININE 2.61* 2.45*  < > 2.48* 2.79* 2.81*  CALCIUM 8.2* 8.4*  < > 9.0 9.3 8.9  PHOS 4.3 3.6  --   --   --   --   < > = values in this interval not displayed. Liver Function Tests:  Recent Labs  12/12/15 0401 12/13/15 1016 12/16/15 0550  AST  --   --  17  ALT  --   --  32  ALKPHOS  --   --  77  BILITOT  --   --  0.8  PROT  --   --  5.5*  ALBUMIN 2.3* 2.2* 2.6*    Recent Labs  12/13/15 1016 12/14/15 1118 12/15/15 0400 12/16/15 0550 12/19/15 1108  WBC 4.2 5.1 4.8 5.9 5.6  NEUTROABS 2.8  --  2.9  --  4  HGB 7.9* 9.9* 9.5* 10.5* 9.5*  HCT 25.5* 31.3* 30.3* 32.5* 30*  MCV 93.4 91.0 90.7 92.1  --   PLT  123* 148* 169 214 238    Cardiac Enzymes:  Recent Labs  12/12/15 0401 12/14/15 1436 12/14/15 1956 12/16/15 0550  CKTOTAL 253*  --   --   --   TROPONINI  --  0.16* 0.18* 0.08*   CBG:  Recent Labs  12/16/15 2206 12/17/15 0746 12/17/15 1156  GLUCAP 239* 273* 279*      Dg Chest 1 View  Result Date: 12/09/2015 CLINICAL DATA:  [place] tonight. History of COPD, hypertension, diabetes, LEFT breast cancer. EXAM: CHEST 1 VIEW COMPARISON:  Chest radiograph April 29, 2008 FINDINGS: Cardiomediastinal silhouette is normal for this apical lordotic technique. Calcified aortic knob. Large hiatal hernia. Hepatic eventration. No pneumothorax. Osteopenia. IMPRESSION: No acute cardiopulmonary process considering apical lordotic technique. Large hiatal hernia. Electronically Signed   By: Elon Alas M.D.   On: 12/09/2015 01:56   US Renal  Result Date: 12/12/2015 CLINICAL DATA:  Acute on chronic renal failure, diabetes mellitus, COPD, asthma, hypertension, former smoker EXAM: RENAL / URINARY TRACT ULTRASOUND COMPLETE COMPARISON:  None FINDINGS: Right Kidney: Length: 10.6 cm. Cortical thinning. Increased cortical echogenicity. Two small cysts identified, mid kidney 12 x 13 x 15 mm and peripelvic 23 x 21 x 18 mm. No definite solid mass or hydronephrosis. No shadowing calcifications. Left Kidney: Length: 9.3 cm. Cortical thinning. Increased cortical echogenicity. Tiny cyst upper pole 12 x 10 x 12 mm, minimally complicated with few internal echoes. Bladder: Well distended, normal appearance IMPRESSION: Medical renal disease changes of both kidneys. BILATERAL small renal cysts, with a minimally complicated 12 mm cyst upper pole LEFT kidney. Electronically  Signed   By: Lavonia Dana M.D.   On: 12/12/2015 18:14   Ct Femur Right Wo Contrast  Result Date: 12/09/2015 CLINICAL DATA:  Acute distal right femur fracture. EXAM: CT OF THE RIGHT FEMUR WITHOUT CONTRAST TECHNIQUE: Multidetector CT imaging was performed  according to the standard protocol. Multiplanar CT image reconstructions were also generated. COMPARISON:  Radiographs dated 12/09/2015 FINDINGS: There is a comminuted fracture of the distal femoral metaphysis just above the femoral component of the total knee prosthesis. There is a 15 mm of posterior displacement of the distal fragment. The fracture does not extend into the femoral condyles. The patella is intact. Proximal tibia and fibula are intact. No loosening of the prosthesis components. Small amount of hemorrhage into the adjacent soft tissues at the fracture site. The proximal femur appears normal. Minimal marginal osteophytes on the right acetabulum. IMPRESSION: Comminuted distal right femoral metaphysis with 15 mm of posterior displacement of the distal fragment. No extension into the femoral condyles. Electronically Signed   By: Lorriane Shire M.D.   On: 12/09/2015 08:27   Dg Chest Port 1 View  Result Date: 12/14/2015 CLINICAL DATA:  Tachypnea EXAM: PORTABLE CHEST 1 VIEW COMPARISON:  12/09/2015 chest radiograph. FINDINGS: Stable cardiomediastinal silhouette with mild cardiomegaly and aortic atherosclerosis. No pneumothorax. Probable trace bilateral pleural effusions. Mild pulmonary edema. Mild bibasilar atelectasis. IMPRESSION: 1. Mild congestive heart failure. 2. Probable trace bilateral pleural effusions and mild bibasilar atelectasis. 3. Aortic atherosclerosis. Electronically Signed   By: Ilona Sorrel M.D.   On: 12/14/2015 12:30   Dg Knee Complete 4 Views Left  Result Date: 12/09/2015 CLINICAL DATA:  Fall at home in her den onto carpeted floor. Bilateral knee pain. EXAM: LEFT KNEE - COMPLETE 4+ VIEW COMPARISON:  None. FINDINGS: No fracture or dislocation. The alignment is maintained. Mild medial tibial femoral joint space narrowing. No joint effusion. Small quadriceps enthesophyte. There is mild prepatellar soft tissue edema. Vascular calcifications are seen. IMPRESSION: No acute fracture or  subluxation. Mild medial tibial femoral joint space narrowing. Electronically Signed   By: Jeb Levering M.D.   On: 12/09/2015 01:54   Dg Knee Complete 4 Views Right  Result Date: 12/09/2015 CLINICAL DATA:  Fall at home in her den onto carpeted floor. Bilateral knee pain. EXAM: RIGHT KNEE - COMPLETE 4+ VIEW COMPARISON:  None. FINDINGS: Displaced angulated distal femur fracture just proximal to right knee arthroplasty. Approximately 2 cm of impaction. There is apex lateral angulation and subluxation of the distal fracture fragment. Fracture extends to the femoral component of the arthroplasty anteriorly. There is been patellar resurfacing. Tibial component of the arthroplasty is intact. There is a lipohemarthrosis. Vascular calcifications are seen. IMPRESSION: Displaced angulated periprosthetic fracture of the distal femur extending to the femoral component of the right knee arthroplasty. Lipohemarthrosis, small to moderate in size. Electronically Signed   By: Jeb Levering M.D.   On: 12/09/2015 01:57   Dg Abd Portable 1v  Result Date: 12/14/2015 CLINICAL DATA:  Constipation. EXAM: PORTABLE ABDOMEN - 1 VIEW COMPARISON:  None. FINDINGS: Normal bowel gas pattern. There is mild increased stool in the rectum. Right iliac artery vascular stent is noted. There are clips in the outer quadrant by prior cholecystectomy. Aortoiliac vascular calcifications are noted. Soft tissues are otherwise unremarkable. Bony structures are demineralized but grossly intact. IMPRESSION: 1. No acute findings.  No evidence of bowel obstruction. 2. Mild increased stool noted in the rectum. Electronically Signed   By: Lajean Manes M.D.   On: 12/14/2015 12:27  Dg C-arm 61-120 Min  Result Date: 12/09/2015 CLINICAL DATA:  ORIF for distal femur fracture. EXAM: RIGHT FEMUR PORTABLE 1 VIEW; DG C-ARM 61-120 MIN COMPARISON:  Earlier the same day. FINDINGS: Image 1 shows the distal right femur fracture which is demonstrated and lateral  projection on image 2. Image 3 shows a long cortical plate and screw fixation device with 3 or 4 metaphyseal screws. Image 4 shows the proximal end of the cortical plate. Images 5 and 6 shows a plate and lateral projection. IMPRESSION: Status post ORIF for distal femur fracture this patient status post total knee replacement. No evidence for immediate hardware complications. Electronically Signed   By: Misty Stanley M.D.   On: 12/09/2015 20:22   Dg Femur [place], [place] 2 Views Right  Result Date: 12/10/2015 CLINICAL DATA:  Postoperative radiographs, status post right femoral plate placement. EXAM: RIGHT FEMUR PORTABLE 1 VIEW COMPARISON:  Intraoperative radiographs performed earlier today at 6:40 p.m. FINDINGS: There has been placement of a plate and screws along the right femur, transfixing the distal femoral fracture in near anatomic alignment. A total knee arthroplasty is grossly unremarkable in appearance. No new fractures are identified. The right femoral head remains seated at the acetabulum. Postoperative soft tissue changes are seen. Scattered vascular calcifications are seen. IMPRESSION: Status post internal fixation of distal femoral fracture in near anatomic alignment. Total knee arthroplasty is grossly unremarkable in appearance. Electronically Signed   By: Garald Balding M.D.   On: 12/10/2015 00:02   Dg Femur Port, Min 2 Views Right  Result Date: 12/09/2015 CLINICAL DATA:  ORIF for distal femur fracture. EXAM: RIGHT FEMUR PORTABLE 1 VIEW; DG C-ARM 61-120 MIN COMPARISON:  Earlier the same day. FINDINGS: Image 1 shows the distal right femur fracture which is demonstrated and lateral projection on image 2. Image 3 shows a long cortical plate and screw fixation device with 3 or 4 metaphyseal screws. Image 4 shows the proximal end of the cortical plate. Images 5 and 6 shows a plate and lateral projection. IMPRESSION: Status post ORIF for distal femur fracture this patient status post total knee replacement.  No evidence for immediate hardware complications. Electronically Signed   By: Misty Stanley M.D.   On: 12/09/2015 20:22    ASSESSMENT/PLAN:  Diabetes mellitus, type II with renal complications - will increase Toujeo 300 units/mL  From 10 units to 13 units subcutaneous daily and continue Onglyza 2.5 mg 1 tab by mouth daily Lab Results  Component Value Date   HGBA1C 6.3 (H) 12/09/2015     Durenda Age, NP [facility] [phone]

## 2015-12-25 LAB — BASIC METABOLIC PANEL
BUN: 47 mg/dL — AB (ref 4–21)
CREATININE: 2.2 mg/dL — AB (ref 0.5–1.1)
Glucose: 161 mg/dL
Potassium: 4.3 mmol/L (ref 3.4–5.3)
Sodium: 144 mmol/L (ref 137–147)

## 2016-01-02 DIAGNOSIS — S72009A Fracture of unspecified part of neck of unspecified femur, initial encounter for closed fracture: Secondary | ICD-10-CM

## 2016-01-02 HISTORY — DX: Fracture of unspecified part of neck of unspecified femur, initial encounter for closed fracture: S72.009A

## 2016-01-13 ENCOUNTER — Non-Acute Institutional Stay (SKILLED_NURSING_FACILITY): Payer: Medicare Other | Admitting: Adult Health

## 2016-01-13 ENCOUNTER — Encounter: Payer: Self-pay | Admitting: Adult Health

## 2016-01-13 DIAGNOSIS — R2681 Unsteadiness on feet: Secondary | ICD-10-CM

## 2016-01-13 DIAGNOSIS — Z853 Personal history of malignant neoplasm of breast: Secondary | ICD-10-CM

## 2016-01-13 DIAGNOSIS — N184 Chronic kidney disease, stage 4 (severe): Secondary | ICD-10-CM

## 2016-01-13 DIAGNOSIS — I10 Essential (primary) hypertension: Secondary | ICD-10-CM | POA: Diagnosis not present

## 2016-01-13 DIAGNOSIS — E785 Hyperlipidemia, unspecified: Secondary | ICD-10-CM | POA: Diagnosis not present

## 2016-01-13 DIAGNOSIS — I509 Heart failure, unspecified: Secondary | ICD-10-CM | POA: Diagnosis not present

## 2016-01-13 DIAGNOSIS — I2510 Atherosclerotic heart disease of native coronary artery without angina pectoris: Secondary | ICD-10-CM

## 2016-01-13 DIAGNOSIS — S72001S Fracture of unspecified part of neck of right femur, sequela: Secondary | ICD-10-CM

## 2016-01-13 DIAGNOSIS — J209 Acute bronchitis, unspecified: Secondary | ICD-10-CM

## 2016-01-13 DIAGNOSIS — D62 Acute posthemorrhagic anemia: Secondary | ICD-10-CM

## 2016-01-13 DIAGNOSIS — M25512 Pain in left shoulder: Secondary | ICD-10-CM

## 2016-01-13 DIAGNOSIS — K5901 Slow transit constipation: Secondary | ICD-10-CM

## 2016-01-13 DIAGNOSIS — J410 Simple chronic bronchitis: Secondary | ICD-10-CM

## 2016-01-13 DIAGNOSIS — E119 Type 2 diabetes mellitus without complications: Secondary | ICD-10-CM

## 2016-01-13 DIAGNOSIS — B372 Candidiasis of skin and nail: Secondary | ICD-10-CM

## 2016-01-13 DIAGNOSIS — E876 Hypokalemia: Secondary | ICD-10-CM

## 2016-01-13 NOTE — Progress Notes (Signed)
Patient ID: [name], female   DOB: 1925/10/20, 82 y.o.   MRN: [id number]    DATE:  01/13/2016   MRN:  [id number]  BIRTHDAY: [birth year]  Facility:  Nursing Home Location:  [place] Room Number: [id number]  LEVEL OF CARE:  SNF (31)  Contact Information    Name Relation Home Work Mobile   [name],[name] Daughter [phone]     Marisa Cyphers   [phone]   Thornell Mule [phone]         Code Status History    Date Active Date Inactive Code Status Order ID Comments User Context   12/09/2015 10:51 PM 12/17/2015  5:52 PM Full Code [id number]  Rod Can, MD Inpatient   12/09/2015 10:51 PM 12/09/2015 10:51 PM Full Code [id number]  Thurnell Lose, MD Inpatient       Chief Complaint  Patient presents with  . Medical Management of Chronic Issues    HISTORY OF PRESENT ILLNESS:  This is an 82 year old female who is being seen for a routine visit. She was recently started on Nystatin ointment for candida under right breast. She is currently on Augmentin for bronchitis. Today, she complains of pain on her left shoulder. She is able to move LUE and no swelling noted.  She has been admitted to [facility] on 12/17/15 from [facility]. She has PMH of chronic kidney disease stage IV, hypertension, diabetes mellitus, COPD, asthma and chronic respiratory failure on 2 L at HS. She is visiting from [place] and had a fall sustaining right distal femur fracture S/P ORIF. She developed pulmonary edema due to fluid resuscitation and was started on IV Lasix and now transitioned to PO Lasix.   PAST MEDICAL HISTORY:  Past Medical History:  Diagnosis Date  . AKI (acute kidney injury) ([place])   . Anemia   . Arthritis   . Asthma   . Cancer [facility])    breast - left  . Chronic kidney disease (CKD), stage IV (severe) ([place])   . COPD (chronic obstructive pulmonary disease) ([place])   . Diabetes mellitus without complication ([place])    Type II  .  Dyslipidemia   . Elevated troponin   . Fall 12/2015  . Fracture of femoral neck, closed ([place]) 01/2016   Right, due to mechanical fall  . Hypertension      CURRENT MEDICATIONS: Reviewed  Patient's Medications  New Prescriptions   No medications on file  Previous Medications   ACETAMINOPHEN (TYLENOL) 325 MG TABLET    Take by mouth every 4 (four) hours as needed for fever.   AMOXICILLIN-CLAVULANATE (AUGMENTIN) 875-125 MG TABLET    Take 1 tablet by mouth 2 (two) times daily.   ANASTROZOLE (ARIMIDEX) 1 MG TABLET    Take 1 mg by mouth daily.    ATORVASTATIN (LIPITOR) 10 MG TABLET    Take 10 mg by mouth daily.   B COMPLEX VITAMINS TABLET    Take 1 tablet by mouth daily.   BUDESONIDE (PULMICORT) 0.25 MG/2ML NEBULIZER SOLUTION    Take 0.25 mg by nebulization daily.    CLOPIDOGREL (PLAVIX) 75 MG TABLET    Take 75 mg by mouth daily.    FERROUS SULFATE (IRON) 325 (65 FE) MG TABS    Take 325 mg by mouth daily.   FORMOTEROL (PERFOROMIST) 20 MCG/2ML NEBULIZER SOLUTION    Take 20 mcg by nebulization daily.    FUROSEMIDE (LASIX) 40 MG TABLET    Take 40 mg by mouth daily.  GARLIC 123XX123 MG CAPS    Take 1,000 mg by mouth daily.   GUAIFENESIN-DEXTROMETHORPHAN (ROBITUSSIN DM) 100-10 MG/5ML SYRUP    Take 10 mLs by mouth every 8 (eight) hours.   HYDROCODONE-ACETAMINOPHEN (NORCO/VICODIN) 5-325 MG TABLET    Take 1-2 tablets by mouth every 6 (six) hours as needed for moderate pain.   INSULIN GLARGINE (TOUJEO SOLOSTAR) 300 UNIT/ML SOPN    Inject 13 Units into the skin daily.    IPRATROPIUM-ALBUTEROL (DUONEB) 0.5-2.5 (3) MG/3ML SOLN    Take 3 mLs by nebulization every 6 (six) hours as needed.   LABETALOL (NORMODYNE) 100 MG TABLET    Take 100 mg by mouth 2 (two) times daily.    MULTIPLE VITAMINS-MINERALS (OCUTABS) TABS    Take 1 tablet by mouth daily.   NIFEDIPINE (PROCARDIA-XL/ADALAT CC) 30 MG 24 HR TABLET    Take 30 mg by mouth 2 (two) times daily.    NITROGLYCERIN (NITROSTAT) 0.4 MG SL TABLET    Place 0.4 mg  under the tongue every 5 (five) minutes as needed for chest pain.   NYSTATIN OINTMENT (MYCOSTATIN)    Apply 1 application topically 3 (three) times daily. Apply to rashes under right breast q shift x2 weeks   OMEGA-3 FATTY ACIDS (OMEGA-3 PLUS PO)    Take 1 capsule by mouth 2 (two) times daily.    OXYGEN    Inhale 2 L/min into the lungs as needed (To maintain O2 sats >92%).   POLYVINYL ALCOHOL-POVIDONE (REFRESH OP)    Place 1 drop into both eyes 2 (two) times daily.   POTASSIUM CHLORIDE SA (K-DUR,KLOR-CON) 20 MEQ TABLET    Take 20 mEq by mouth daily.   PREDNISOLONE ACETATE (PRED FORTE) 1 % OPHTHALMIC SUSPENSION    3 drops 2 (two) times daily.   SACCHAROMYCES BOULARDII (FLORASTOR) 250 MG CAPSULE    Take 250 mg by mouth 2 (two) times daily.   SAXAGLIPTIN HCL (ONGLYZA) 2.5 MG TABS TABLET    Take 2.5 mg by mouth daily.    SENNA-DOCUSATE (SENOKOT-S) 8.6-50 MG TABLET    Take 1 tablet by mouth 2 (two) times daily.   TRAMADOL (ULTRAM) 50 MG TABLET    Take 1 tablet (50 mg total) by mouth every 6 (six) hours as needed for moderate pain.   VENTOLIN HFA 108 (90 BASE) MCG/ACT INHALER    Inhale 2 puffs into the lungs every 4 (four) hours as needed for wheezing or shortness of breath.   Modified Medications   No medications on file  Discontinued Medications   ENOXAPARIN (LOVENOX) 30 MG/0.3ML INJECTION    Inject 30 mg into the skin daily. Stop date 01/08/16     Allergies  Allergen Reactions  . Sulfa Antibiotics Other (See Comments)    Reaction unknown  . Tape Other (See Comments)    Blisters  . Penicillins Rash    Has patient had a PCN reaction causing immediate rash, facial/tongue/throat swelling, SOB or lightheadedness with hypotension: yes Has patient had a PCN reaction causing severe rash involving mucus membranes or skin necrosis: no Has patient had a PCN reaction that required hospitalization yes as a teenager Has patient had a PCN reaction occurring within the last 10 years: no was a teenager If  all of the above answers are "NO", then may proceed with Cephalosporin use.      REVIEW OF SYSTEMS:  GENERAL: no change in appetite, no fatigue, no weight changes, no fever, chills or weakness EYES: Denies change in vision, dry eyes, eye  pain, itching or discharge EARS: Denies change in hearing, ringing in ears, or earache NOSE: Denies nasal congestion or epistaxis MOUTH and THROAT: Denies oral discomfort, gingival pain or bleeding, pain from teeth or hoarseness   RESPIRATORY: no cough, SOB, DOE, wheezing, hemoptysis CARDIAC: no chest pain, edema or palpitations GI: no abdominal pain, diarrhea, constipation, heart burn, nausea or vomiting GU: Denies dysuria, frequency, hematuria, incontinence, or discharge PSYCHIATRIC: Denies feeling of depression or anxiety. No report of hallucinations, insomnia, paranoia, or agitation     PHYSICAL EXAMINATION  GENERAL APPEARANCE: Well nourished. In no acute distress. Normal body habitus SKIN:  Right femur surgical incision is healed HEAD: Normal in size and contour. No evidence of trauma EYES: Lids open and close normally. No blepharitis, entropion or ectropion. PERRL. Conjunctivae are clear and sclerae are white. Lenses are without opacity EARS: Pinnae are normal. Patient hears normal voice tunes of the examiner MOUTH and THROAT: Lips are without lesions. Oral mucosa is moist and without lesions. Tongue is normal in shape, size, and color and without lesions NECK: supple, trachea midline, no neck masses, no thyroid tenderness, no thyromegaly LYMPHATICS: no LAN in the neck, no supraclavicular LAN RESPIRATORY: breathing is even & unlabored, BS CTAB CARDIAC: RRR, no murmur,no extra heart sounds, no edema GI: abdomen soft, normal BS, no masses, no tenderness, no hepatomegaly, no splenomegaly EXTREMITIES:   Able to move 4 extremities PSYCHIATRIC: Alert and oriented X 3. Affect and behavior are appropriate  LABS/RADIOLOGY: Labs reviewed: Basic  Metabolic Panel:  Recent Labs  12/12/15 0401 12/13/15 1016  12/15/15 0400 12/16/15 0550 12/17/15 0510 12/25/15  NA 136 138  < > 139 141 139 144  K 4.8 4.3  < > 3.7 3.3* 4.0 4.3  CL 107 109  < > 109 103 104  --   CO2 22 22  < > 24 27 27   --   GLUCOSE 149* 217*  < > 180* 183* 146*  --   BUN 51* 48*  < > 48* 54* 59* 47*  CREATININE 2.61* 2.45*  < > 2.48* 2.79* 2.81* 2.2*  CALCIUM 8.2* 8.4*  < > 9.0 9.3 8.9  --   PHOS 4.3 3.6  --   --   --   --   --   < > = values in this interval not displayed. Liver Function Tests:  Recent Labs  12/12/15 0401 12/13/15 1016 12/16/15 0550  AST  --   --  17  ALT  --   --  32  ALKPHOS  --   --  77  BILITOT  --   --  0.8  PROT  --   --  5.5*  ALBUMIN 2.3* 2.2* 2.6*    Recent Labs  12/13/15 1016 12/14/15 1118 12/15/15 0400 12/16/15 0550 12/19/15 1108  WBC 4.2 5.1 4.8 5.9 5.6  NEUTROABS 2.8  --  2.9  --  4  HGB 7.9* 9.9* 9.5* 10.5* 9.5*  HCT 25.5* 31.3* 30.3* 32.5* 30*  MCV 93.4 91.0 90.7 92.1  --   PLT 123* 148* 169 214 238    Cardiac Enzymes:  Recent Labs  12/12/15 0401 12/14/15 1436 12/14/15 1956 12/16/15 0550  CKTOTAL 253*  --   --   --   TROPONINI  --  0.16* 0.18* 0.08*   CBG:  Recent Labs  12/16/15 2206 12/17/15 0746 12/17/15 1156  GLUCAP 239* 273* 279*       ASSESSMENT/PLAN:  Unsteady gait - continue rehabilitation, PT and OT; fall precaution  Right femur fracture S/P ORIF - continue rehabilitation, PT and OT; continue Norco 5/325 mg 1-2 tabs by mouth every 6 hours when necessary and tramadol 50 mg 1 tab by mouth every 6 hours when necessary for pain; follow-up with orthopedic surgeon, Dr. Aaron Edelman [place]inte[place]  Bronchitis - continue Augmentin 875 mg 1 tab Q 12 hours for a total of 7 days, Robitussin 10 ml Q 8 Hours for a total of 5 days and Florastor 250 mg 1 capsule BID for a total of 2 weeks  CHF -  No SOB; continue Lasix 40 mg daily; check BMP  Chronic renal failure stage IV - stable Lab Results   Component Value Date   CREATININE 2.2 (A) 12/25/2015   Hypertension - continue labetalol 100 mg 1 tab by mouth twice a day, nifedipine ER 30 mg 1 tab by mouth twice a day  Constipation - continue senna S 1 tab by mouth twice a day  Hyperlipidemia - continue Lipitor 10 mg 1 tab by mouth daily ; check lipid panel  Acute blood loss anemia - stable;  continue iron 325 mg 1 tab by mouth daily; check cbc Lab Results  Component Value Date   HGB 9.5 (A) 12/19/2015    Diabetes mellitus, type II with renal complications - continue Toujeo 300 units/mL 10 units subcutaneous daily, Onglyza 2.5 mg 1 tab by mouth daily Lab Results  Component Value Date   HGBA1C 6.3 (H) 12/09/2015    Left breast cancer - continue anastrozole 1 mg 1 tab by mouth daily  CAD S/P stent placement in 2009 - stable; continue Plavix 75 mg 1 tab by mouth daily and NTG PRN  COPD - no SOB; continue Pulmicort 0.25 mg/2 mL take 0.25 mg via nebulization daily, Performist 20 mcg/2 ml via nebulization daily, Ventolin HFA 90 mcg inhale 2 puffs into lungs Q 4 hours PRN  Hypokalemia - stable; continue Klor-Con 20 meq daily Lab Results  Component Value Date   K 4.3 12/25/2015   Hyperlipidemia - continue Lipitor 10 mg 1 tab  and Omega 3 fish oil 1 capsule PO BID Lab Results  Component Value Date   CHOL 116 12/19/2015   HDL 31 (A) 12/19/2015   LDLCALC 63 12/19/2015   TRIG 113 12/19/2015   Candida, skin - continue Nystatin ointment 100,000 units/gm topically to rashes under right breast Q shift for a total of 2 weeks  Left shoulder Pain -  Start Biofreeze gel 4% topically to left shoulder Q shift     Goals of care:  Short-term rehabilitation     Durenda Age, NP [place] [phone]

## 2016-01-14 LAB — BASIC METABOLIC PANEL
BUN: 53 mg/dL — AB (ref 4–21)
CREATININE: 2.1 mg/dL — AB (ref 0.5–1.1)
GLUCOSE: 158 mg/dL
POTASSIUM: 3.9 mmol/L (ref 3.4–5.3)
Sodium: 142 mmol/L (ref 137–147)

## 2016-01-14 LAB — CBC AND DIFFERENTIAL
HCT: 27 % — AB (ref 36–46)
Hemoglobin: 8.1 g/dL — AB (ref 12.0–16.0)
Neutrophils Absolute: 4 /uL
Platelets: 117 10*3/uL — AB (ref 150–399)
WBC: 6.3 10^3/mL

## 2016-01-21 ENCOUNTER — Encounter: Payer: Self-pay | Admitting: Adult Health

## 2016-01-21 ENCOUNTER — Non-Acute Institutional Stay (SKILLED_NURSING_FACILITY): Payer: Medicare Other | Admitting: Adult Health

## 2016-01-21 DIAGNOSIS — I509 Heart failure, unspecified: Secondary | ICD-10-CM

## 2016-01-21 DIAGNOSIS — R600 Localized edema: Secondary | ICD-10-CM

## 2016-01-21 NOTE — Progress Notes (Signed)
Patient ID: [name], female   DOB: 1925/10/07, 82 y.o.   MRN: [id number]    DATE:  01/21/16  MRN:  [id number]  BIRTHDAY: [birth year]  Facility:  Nursing Home Location:  [place] Room Number: [id number]  LEVEL OF CARE:  SNF (31)  Contact Information    Name Relation Home Work Mobile   [name],[name] Daughter [phone]     Marisa Cyphers   [phone]   Thornell Mule [phone]         Code Status History    Date Active Date Inactive Code Status Order ID Comments User Context   12/09/2015 10:51 PM 12/17/2015  5:52 PM Full Code [id number]  Rod Can, MD Inpatient   12/09/2015 10:51 PM 12/09/2015 10:51 PM Full Code [id number]  Thurnell Lose, MD Inpatient       Chief Complaint  Patient presents with  . Acute Visit    Weight gain, CHF    HISTORY OF PRESENT ILLNESS:  This is an 82 year old female who was noted to have significant weight gain. She has admission weight of 188.8 lbs and current weight of 196.4 lbs , 7.6 lbs weight gain in 1 month. She is currently taking Lasix 40 mg for CHF. She has occasional SOB but she has requested for her  O2 to be weaned off since she does not use O2 @ home in the morning. She reports using O2 only @ night.  She has BLE edema 2+ on RLE and 1+ LLE and does not have feet pain.   She is currently having a short-term rehabilitation due to right femur fracture S/P ORIF.  PAST MEDICAL HISTORY:  Past Medical History:  Diagnosis Date  . AKI (acute kidney injury) ([place])   . Anemia   . Arthritis   . Asthma   . Cancer [facility])    breast - left  . Chronic kidney disease (CKD), stage IV (severe) ([place])   . COPD (chronic obstructive pulmonary disease) ([place])   . Diabetes mellitus without complication ([place])    Type II  . Dyslipidemia   . Elevated troponin   . Fall 12/2015  . Fracture of femoral neck, closed ([place]) 01/2016   Right, due to mechanical fall  . Hypertension      CURRENT MEDICATIONS:  Reviewed  Patient's Medications  New Prescriptions   No medications on file  Previous Medications   ACETAMINOPHEN (TYLENOL) 325 MG TABLET    Take 650 mg by mouth every 4 (four) hours as needed for fever.    ANASTROZOLE (ARIMIDEX) 1 MG TABLET    Take 1 mg by mouth daily.    ATORVASTATIN (LIPITOR) 10 MG TABLET    Take 10 mg by mouth daily.   B COMPLEX VITAMINS TABLET    Take 1 tablet by mouth daily.   BUDESONIDE (PULMICORT) 0.25 MG/2ML NEBULIZER SOLUTION    Take 0.25 mg by nebulization daily.    CLOPIDOGREL (PLAVIX) 75 MG TABLET    Take 75 mg by mouth daily.    FERROUS SULFATE (IRON) 325 (65 FE) MG TABS    Take 325 mg by mouth daily.   FORMOTEROL (PERFOROMIST) 20 MCG/2ML NEBULIZER SOLUTION    Take 20 mcg by nebulization daily.    FUROSEMIDE (LASIX) 40 MG TABLET    Take 40 mg by mouth daily.    GARLIC 123XX123 MG CAPS    Take 1,000 mg by mouth daily.   HYDROCODONE-ACETAMINOPHEN (NORCO/VICODIN) 5-325 MG TABLET    Take 1-2 tablets  by mouth every 6 (six) hours as needed for moderate pain.   INSULIN GLARGINE (TOUJEO SOLOSTAR) 300 UNIT/ML SOPN    Inject 13 Units into the skin daily.    IPRATROPIUM-ALBUTEROL (DUONEB) 0.5-2.5 (3) MG/3ML SOLN    Take 3 mLs by nebulization every 6 (six) hours as needed.   LABETALOL (NORMODYNE) 100 MG TABLET    Take 100 mg by mouth 2 (two) times daily.    MENTHOL, TOPICAL ANALGESIC, (BIOFREEZE) 4 % GEL    Apply 1 application topically 3 (three) times daily. Apply to shoulder each shift   MULTIPLE VITAMINS-MINERALS (OCUTABS) TABS    Take 1 tablet by mouth daily.   NIFEDIPINE (PROCARDIA-XL/ADALAT CC) 30 MG 24 HR TABLET    Take 30 mg by mouth 2 (two) times daily.    NITROGLYCERIN (NITROSTAT) 0.4 MG SL TABLET    Place 0.4 mg under the tongue every 5 (five) minutes as needed for chest pain.   NYSTATIN OINTMENT (MYCOSTATIN)    Apply 1 application topically 3 (three) times daily. Apply to rashes under right breast q shift x2 weeks   OMEGA-3 FATTY ACIDS (OMEGA-3 PLUS PO)    Take 1  capsule by mouth 2 (two) times daily.    OXYGEN    Inhale 2 L/min into the lungs as needed (To maintain O2 sats >92%).   POLYVINYL ALCOHOL-POVIDONE (REFRESH OP)    Place 1 drop into both eyes 2 (two) times daily.    POTASSIUM CHLORIDE SA (K-DUR,KLOR-CON) 20 MEQ TABLET    Take 20 mEq by mouth daily.   PREDNISOLONE ACETATE (PRED FORTE) 1 % OPHTHALMIC SUSPENSION    3 drops 2 (two) times daily.   SAXAGLIPTIN HCL (ONGLYZA) 2.5 MG TABS TABLET    Take 2.5 mg by mouth daily.    SENNA-DOCUSATE (SENOKOT-S) 8.6-50 MG TABLET    Take 1 tablet by mouth 2 (two) times daily.   TRAMADOL (ULTRAM) 50 MG TABLET    Take 1 tablet (50 mg total) by mouth every 6 (six) hours as needed for moderate pain.   VENTOLIN HFA 108 (90 BASE) MCG/ACT INHALER    Inhale 2 puffs into the lungs every 4 (four) hours as needed for wheezing or shortness of breath.   Modified Medications   No medications on file  Discontinued Medications   No medications on file     Allergies  Allergen Reactions  . Sulfa Antibiotics Other (See Comments)    Reaction unknown  . Tape Other (See Comments)    Blisters  . Penicillins Rash    Has patient had a PCN reaction causing immediate rash, facial/tongue/throat swelling, SOB or lightheadedness with hypotension: yes Has patient had a PCN reaction causing severe rash involving mucus membranes or skin necrosis: no Has patient had a PCN reaction that required hospitalization yes as a teenager Has patient had a PCN reaction occurring within the last 10 years: no was a teenager If all of the above answers are "NO", then may proceed with Cephalosporin use.      REVIEW OF SYSTEMS:  GENERAL: no change in appetite, no fatigue, no weight changes, no fever, chills or weakness EYES: Denies change in vision, dry eyes, eye pain, itching or discharge EARS: Denies change in hearing, ringing in ears, or earache NOSE: Denies nasal congestion or epistaxis MOUTH and THROAT: Denies oral discomfort, gingival  pain or bleeding, pain from teeth or hoarseness   RESPIRATORY: no cough, SOB, DOE, wheezing, hemoptysis CARDIAC: no chest pain, or palpitations GI: no abdominal pain,  diarrhea, constipation, heart burn, nausea or vomiting GU: Denies dysuria, frequency, hematuria, incontinence, or discharge PSYCHIATRIC: Denies feeling of depression or anxiety. No report of hallucinations, insomnia, paranoia, or agitation     PHYSICAL EXAMINATION  GENERAL APPEARANCE: Well nourished. In no acute distress. Normal body habitus SKIN:  Right femur surgical incision is healed HEAD: Normal in size and contour. No evidence of trauma EYES: Lids open and close normally. No blepharitis, entropion or ectropion. PERRL. Conjunctivae are clear and sclerae are white. Lenses are without opacity EARS: Pinnae are normal. Patient hears normal voice tunes of the examiner MOUTH and THROAT: Lips are without lesions. Oral mucosa is moist and without lesions. Tongue is normal in shape, size, and color and without lesions NECK: supple, trachea midline, no neck masses, no thyroid tenderness, no thyromegaly LYMPHATICS: no LAN in the neck, no supraclavicular LAN RESPIRATORY: breathing is even & unlabored, BS CTAB CARDIAC: RRR, no murmur,no extra heart sounds, BLE  Edema, 2+ RLE and 1+LLE GI: abdomen soft, normal BS, no masses, no tenderness, no hepatomegaly, no splenomegaly EXTREMITIES:   Able to move 4 extremities PSYCHIATRIC: Alert and oriented X 3. Affect and behavior are appropriate  LABS/RADIOLOGY: Labs reviewed: Basic Metabolic Panel:  Recent Labs  12/12/15 0401 12/13/15 1016  12/15/15 0400 12/16/15 0550 12/17/15 0510 12/25/15 01/14/16  NA 136 138  < > 139 141 139 144 142  K 4.8 4.3  < > 3.7 3.3* 4.0 4.3 3.9  CL 107 109  < > 109 103 104  --   --   CO2 22 22  < > 24 27 27   --   --   GLUCOSE 149* 217*  < > 180* 183* 146*  --   --   BUN 51* 48*  < > 48* 54* 59* 47* 53*  CREATININE 2.61* 2.45*  < > 2.48* 2.79* 2.81*  2.2* 2.1*  CALCIUM 8.2* 8.4*  < > 9.0 9.3 8.9  --   --   PHOS 4.3 3.6  --   --   --   --   --   --   < > = values in this interval not displayed. Liver Function Tests:  Recent Labs  12/12/15 0401 12/13/15 1016 12/16/15 0550  AST  --   --  17  ALT  --   --  32  ALKPHOS  --   --  77  BILITOT  --   --  0.8  PROT  --   --  5.5*  ALBUMIN 2.3* 2.2* 2.6*    Recent Labs  12/14/15 1118 12/15/15 0400 12/16/15 0550 12/19/15 1108 01/14/16  WBC 5.1 4.8 5.9 5.6 6.3  NEUTROABS  --  2.9  --  4 4  HGB 9.9* 9.5* 10.5* 9.5* 8.1*  HCT 31.3* 30.3* 32.5* 30* 27*  MCV 91.0 90.7 92.1  --   --   PLT 148* 169 214 238 117*    Cardiac Enzymes:  Recent Labs  12/12/15 0401 12/14/15 1436 12/14/15 1956 12/16/15 0550  CKTOTAL 253*  --   --   --   TROPONINI  --  0.16* 0.18* 0.08*   CBG:  Recent Labs  12/16/15 2206 12/17/15 0746 12/17/15 1156  GLUCAP 239* 273* 279*       ASSESSMENT/PLAN:  CHF -  No SOB; continue Lasix 40 mg daily; check BNP and get CXR; weigh daily  BLE Edema - check for bilateral doppler venous ultrasound      Durenda Age, NP [facility] [phone]

## 2016-01-22 LAB — BASIC METABOLIC PANEL
BUN: 31 mg/dL — AB (ref 4–21)
Creatinine: 2.1 mg/dL — AB (ref 0.5–1.1)
GLUCOSE: 164 mg/dL
Potassium: 4.2 mmol/L (ref 3.4–5.3)
Sodium: 144 mmol/L (ref 137–147)

## 2016-01-22 LAB — CBC AND DIFFERENTIAL
HCT: 27 % — AB (ref 36–46)
Hemoglobin: 8.2 g/dL — AB (ref 12.0–16.0)
PLATELETS: 182 10*3/uL (ref 150–399)
WBC: 7.1 10^3/mL

## 2016-01-28 ENCOUNTER — Encounter: Payer: Self-pay | Admitting: Nurse Practitioner

## 2016-01-28 ENCOUNTER — Non-Acute Institutional Stay (SKILLED_NURSING_FACILITY): Payer: Medicare Other | Admitting: Nurse Practitioner

## 2016-01-28 DIAGNOSIS — I509 Heart failure, unspecified: Secondary | ICD-10-CM

## 2016-01-28 DIAGNOSIS — R600 Localized edema: Secondary | ICD-10-CM

## 2016-01-28 NOTE — Progress Notes (Signed)
Patient ID: [name], female   DOB: [birth year], 82 y.o.   MRN: [id number]    Nursing Home Location:    [place] of Service: SNF (31)  PCP: Pcp Not In System  Allergies  Allergen Reactions  . Sulfa Antibiotics Other (See Comments)    Reaction unknown  . Tape Other (See Comments)    Blisters  . Penicillins Rash    Has patient had a PCN reaction causing immediate rash, facial/tongue/throat swelling, SOB or lightheadedness with hypotension: yes Has patient had a PCN reaction causing severe rash involving mucus membranes or skin necrosis: no Has patient had a PCN reaction that required hospitalization yes as a teenager Has patient had a PCN reaction occurring within the last 10 years: no was a teenager If all of the above answers are "NO", then may proceed with Cephalosporin use.     Chief Complaint  Patient presents with  . Acute Visit    Erythematous and edematous right lower leg    HPI:  Patient is a 82 y.o. female seen today at the request of nursing due to edematous right lower leg. Pt with hx of CHF, right femur fracture s/p ORIF, CKD, anemia, asthma and others.  Pt reports legs have been swollen since surgery and admission and overall swelling has improved. Reports swelling gets worse as the day goes on.  Has some tenderness to legs when being touched. Noticed more bruising  to lower extremities since surgery but notes she does bruise easily and points to her arms (on plavix).  No increase in shortness of breath, cough or congestion. Weight has been stable.  No fevers or chills.  Overall feeling well, having her feet soaked in water by her friend at time of visit.   Review of Systems:  Review of Systems  Constitutional: Negative for activity change, appetite change, chills, fatigue and fever.  Respiratory: Negative for cough and shortness of breath.   Cardiovascular: Positive for leg swelling (chronic, has improved). Negative for chest pain and palpitations.   Gastrointestinal: Negative for constipation and diarrhea.  Genitourinary: Negative for dysuria and frequency.  Musculoskeletal: Negative for back pain and myalgias.  Skin: Negative.   Neurological: Negative for dizziness and headaches.    Past Medical History:  Diagnosis Date  . AKI (acute kidney injury) ([place])   . Anemia   . Arthritis   . Asthma   . Cancer [facility])    breast - left  . Chronic kidney disease (CKD), stage IV (severe) ([place])   . COPD (chronic obstructive pulmonary disease) ([place])   . Diabetes mellitus without complication ([place])    Type II  . Dyslipidemia   . Elevated troponin   . Fall 12/2015  . Fracture of femoral neck, closed ([place]) 01/2016   Right, due to mechanical fall  . Hypertension    Past Surgical History:  Procedure Laterality Date  . ABDOMINAL HYSTERECTOMY  1975  . APPENDECTOMY  1947  . BREAST LUMPECTOMY Left 2015  . FEMORAL ARTERY STENT     x2  . REPLACEMENT TOTAL KNEE Right   . STENT PLACEMENT VASCULAR ([place] HX)     2 in chest  . TONSILLECTOMY  1934  . TOTAL KNEE ARTHROPLASTY Right 12/09/2015   Procedure: ORIF OF DISTAL FEMUR;  Surgeon: Rod Can, MD;  Location: [facility];  Service: Orthopedics;  Laterality: Right;   Social History:   reports that she quit smoking about 17 years ago. Her smoking use included Cigarettes. She has never  used smokeless tobacco. She reports that she does not drink alcohol or use drugs.  History reviewed. No pertinent family history.  Medications: Patient's Medications  New Prescriptions   No medications on file  Previous Medications   ACETAMINOPHEN (TYLENOL) 325 MG TABLET    Take 650 mg by mouth every 4 (four) hours as needed for fever.    ANASTROZOLE (ARIMIDEX) 1 MG TABLET    Take 1 mg by mouth daily.    ATORVASTATIN (LIPITOR) 10 MG TABLET    Take 10 mg by mouth daily.   B COMPLEX VITAMINS TABLET    Take 1 tablet by mouth daily.   BUDESONIDE (PULMICORT) 0.25 MG/2ML NEBULIZER SOLUTION    Take 0.25 mg by  nebulization daily.    CLOPIDOGREL (PLAVIX) 75 MG TABLET    Take 75 mg by mouth daily.    FERROUS SULFATE (IRON) 325 (65 FE) MG TABS    Take 325 mg by mouth daily.   FORMOTEROL (PERFOROMIST) 20 MCG/2ML NEBULIZER SOLUTION    Take 20 mcg by nebulization daily.    GARLIC 123XX123 MG CAPS    Take 1,000 mg by mouth daily.   GUAIFENESIN-DEXTROMETHORPHAN (ROBITUSSIN DM) 100-10 MG/5ML SYRUP    Take 10 mLs by mouth every 6 (six) hours as needed for cough.   HYDROCODONE-ACETAMINOPHEN (NORCO/VICODIN) 5-325 MG TABLET    Take 1-2 tablets by mouth every 6 (six) hours as needed for moderate pain.   INSULIN GLARGINE (TOUJEO SOLOSTAR) 300 UNIT/ML SOPN    Inject 13 Units into the skin daily.    IPRATROPIUM-ALBUTEROL (DUONEB) 0.5-2.5 (3) MG/3ML SOLN    Take 3 mLs by nebulization every 6 (six) hours as needed.   LABETALOL (NORMODYNE) 100 MG TABLET    Take 100 mg by mouth 2 (two) times daily.    LEVOFLOXACIN (LEVAQUIN) 500 MG TABLET    Take 500 mg by mouth daily.   MENTHOL, TOPICAL ANALGESIC, (BIOFREEZE) 4 % GEL    Apply 1 application topically 3 (three) times daily as needed. Apply to shoulder each shift PRN   MULTIPLE VITAMINS-MINERALS (OCUTABS) TABS    Take 1 tablet by mouth daily.   NIFEDIPINE (PROCARDIA-XL/ADALAT CC) 30 MG 24 HR TABLET    Take 30 mg by mouth 2 (two) times daily.    NITROGLYCERIN (NITROSTAT) 0.4 MG SL TABLET    Place 0.4 mg under the tongue every 5 (five) minutes as needed for chest pain.   OXYGEN    Inhale 2 L/min into the lungs as needed (To maintain O2 sats >92%).   POLYVINYL ALCOHOL-POVIDONE (REFRESH OP)    Place 1 drop into both eyes 2 (two) times daily.    POTASSIUM CHLORIDE SA (K-DUR,KLOR-CON) 20 MEQ TABLET    Take 20 mEq by mouth daily.   SAXAGLIPTIN HCL (ONGLYZA) 2.5 MG TABS TABLET    Take 2.5 mg by mouth daily.    SENNA-DOCUSATE (SENOKOT-S) 8.6-50 MG TABLET    Take 1 tablet by mouth 2 (two) times daily.   TORSEMIDE (DEMADEX) 20 MG TABLET    Take 20 mg by mouth 2 (two) times daily. Take 20  mg BID x5 days ending 9/27, then take 20 mg qd.   TRAMADOL (ULTRAM) 50 MG TABLET    Take 1 tablet (50 mg total) by mouth every 6 (six) hours as needed for moderate pain.   VENTOLIN HFA 108 (90 BASE) MCG/ACT INHALER    Inhale 2 puffs into the lungs every 4 (four) hours as needed for wheezing or shortness of breath.  Modified Medications   No medications on file  Discontinued Medications   FUROSEMIDE (LASIX) 40 MG TABLET    Take 40 mg by mouth daily.    OMEGA-3 FATTY ACIDS (OMEGA-3 PLUS PO)    Take 1 capsule by mouth 2 (two) times daily.    PREDNISOLONE ACETATE (PRED FORTE) 1 % OPHTHALMIC SUSPENSION    3 drops 2 (two) times daily.     Physical Exam: Vitals:   01/28/16 1028  BP: 122/60  Pulse: 79  Resp: 18  Temp: 97.8 F (36.6 C)  TempSrc: Oral  SpO2: 98%  Weight: 175 lb (79.4 kg)  Height: 5\' 4"  (1.626 m)    Physical Exam  Constitutional: She is oriented to person, place, and time. She appears well-developed and well-nourished. No distress.  HENT:  Head: Normocephalic and atraumatic.  Mouth/Throat: Oropharynx is clear and moist. No oropharyngeal exudate.  Eyes: Conjunctivae are normal. Pupils are equal, round, and reactive to light.  Neck: Normal range of motion. Neck supple.  Cardiovascular: Normal rate, regular rhythm and normal heart sounds.   Pulmonary/Chest: Effort normal and breath sounds normal.  Diminished throughout  Abdominal: Soft. Bowel sounds are normal.  Musculoskeletal: She exhibits edema. She exhibits no tenderness.  2+ pitting edema to right, 1+ to left lower extremity   Neurological: She is alert and oriented to person, place, and time.  Skin: Skin is warm and dry. She is not diaphoretic.  Slight redness noted to bilateral lower extremity, scattered small areas of  ecchymosis to bilateral lower extremity, no heat or drainage noted      Labs reviewed: Basic Metabolic Panel:  Recent Labs  12/12/15 0401 12/13/15 1016  12/15/15 0400 12/16/15 0550  12/17/15 0510 12/25/15 01/14/16 01/22/16  NA 136 138  < > 139 141 139 144 142 144  K 4.8 4.3  < > 3.7 3.3* 4.0 4.3 3.9 4.2  CL 107 109  < > 109 103 104  --   --   --   CO2 22 22  < > 24 27 27   --   --   --   GLUCOSE 149* 217*  < > 180* 183* 146*  --   --   --   BUN 51* 48*  < > 48* 54* 59* 47* 53* 31*  CREATININE 2.61* 2.45*  < > 2.48* 2.79* 2.81* 2.2* 2.1* 2.1*  CALCIUM 8.2* 8.4*  < > 9.0 9.3 8.9  --   --   --   PHOS 4.3 3.6  --   --   --   --   --   --   --   < > = values in this interval not displayed. Liver Function Tests:  Recent Labs  12/12/15 0401 12/13/15 1016 12/16/15 0550  AST  --   --  17  ALT  --   --  32  ALKPHOS  --   --  77  BILITOT  --   --  0.8  PROT  --   --  5.5*  ALBUMIN 2.3* 2.2* 2.6*   No results for input(s): LIPASE, AMYLASE in the last 8760 hours. No results for input(s): AMMONIA in the last 8760 hours. CBC:  Recent Labs  12/14/15 1118 12/15/15 0400 12/16/15 0550 12/19/15 1108 01/14/16 01/22/16  WBC 5.1 4.8 5.9 5.6 6.3 7.1  NEUTROABS  --  2.9  --  4 4  --   HGB 9.9* 9.5* 10.5* 9.5* 8.1* 8.2*  HCT 31.3* 30.3* 32.5* 30* 27* 27*  MCV 91.0 90.7 92.1  --   --   --   PLT 148* 169 214 238 117* 182   TSH: No results for input(s): TSH in the last 8760 hours. A1C: Lab Results  Component Value Date   HGBA1C 6.3 (H) 12/09/2015   Lipid Panel:  Recent Labs  12/19/15 1108  CHOL 116  HDL 31*  LDLCALC 63  TRIG 113    Radiological Exams: 01/21/16 Chest xray: heart and mediastinum normal, lung fields and costophrenic angles are clear, no pulmonary infiltrate, no change since 01/06/16  Lower ext bilateral US: negative for DVT Assessment/Plan 1. Congestive heart failure, unspecified congestive heart failure chronicity, unspecified congestive heart failure type (HCC) No signs of worsening CHF, weight has been stable. No increase in shortness of breath or chest pains. Chest xray normal from 01/21/16  2. Lower leg edema consistent with venous  insufficieny, pt  with legs dependent throughout the day. Improvement in edema in the morning with worsening throughout the day. Edema appears to be stable from last visit on 01/21/16.  Encouraged to the use of TED hose (which she has but hose were on top of a dresser not being used)  Elevating LE when she is not active.  To notify if swelling, redness or pain becomes worse    Kazi Montoro K. Harle Battiest  [facility] [phone] 8 am - 5 pm) [phone] (after hours)

## 2016-01-30 LAB — BASIC METABOLIC PANEL
BUN: 31 mg/dL — AB (ref 4–21)
Creatinine: 2.3 mg/dL — AB (ref 0.5–1.1)
Glucose: 199 mg/dL
Potassium: 3.4 mmol/L (ref 3.4–5.3)
SODIUM: 146 mmol/L (ref 137–147)

## 2016-02-02 ENCOUNTER — Encounter: Payer: Self-pay | Admitting: Adult Health

## 2016-02-02 ENCOUNTER — Non-Acute Institutional Stay (SKILLED_NURSING_FACILITY): Payer: Medicare Other | Admitting: Adult Health

## 2016-02-02 DIAGNOSIS — R2681 Unsteadiness on feet: Secondary | ICD-10-CM

## 2016-02-02 DIAGNOSIS — J410 Simple chronic bronchitis: Secondary | ICD-10-CM

## 2016-02-02 DIAGNOSIS — I10 Essential (primary) hypertension: Secondary | ICD-10-CM | POA: Diagnosis not present

## 2016-02-02 DIAGNOSIS — K5901 Slow transit constipation: Secondary | ICD-10-CM | POA: Diagnosis not present

## 2016-02-02 DIAGNOSIS — Z853 Personal history of malignant neoplasm of breast: Secondary | ICD-10-CM

## 2016-02-02 DIAGNOSIS — N179 Acute kidney failure, unspecified: Secondary | ICD-10-CM | POA: Diagnosis not present

## 2016-02-02 DIAGNOSIS — E1122 Type 2 diabetes mellitus with diabetic chronic kidney disease: Secondary | ICD-10-CM

## 2016-02-02 DIAGNOSIS — I509 Heart failure, unspecified: Secondary | ICD-10-CM | POA: Diagnosis not present

## 2016-02-02 DIAGNOSIS — S72001S Fracture of unspecified part of neck of right femur, sequela: Secondary | ICD-10-CM

## 2016-02-02 DIAGNOSIS — N184 Chronic kidney disease, stage 4 (severe): Secondary | ICD-10-CM

## 2016-02-02 DIAGNOSIS — Z794 Long term (current) use of insulin: Secondary | ICD-10-CM

## 2016-02-02 DIAGNOSIS — E876 Hypokalemia: Secondary | ICD-10-CM | POA: Diagnosis not present

## 2016-02-02 DIAGNOSIS — I2510 Atherosclerotic heart disease of native coronary artery without angina pectoris: Secondary | ICD-10-CM | POA: Diagnosis not present

## 2016-02-02 DIAGNOSIS — E785 Hyperlipidemia, unspecified: Secondary | ICD-10-CM

## 2016-02-02 DIAGNOSIS — M25512 Pain in left shoulder: Secondary | ICD-10-CM

## 2016-02-02 LAB — BASIC METABOLIC PANEL
BUN: 28 mg/dL — AB (ref 4–21)
CREATININE: 2 mg/dL — AB (ref 0.5–1.1)
GLUCOSE: 215 mg/dL
POTASSIUM: 3.9 mmol/L (ref 3.4–5.3)
SODIUM: 146 mmol/L (ref 137–147)

## 2016-02-02 NOTE — Progress Notes (Signed)
Patient ID: [name], female   DOB: Feb 02, 1926, 82 y.o.   MRN: [id number]    DATE:    02/02/16   MRN:  [id number]  BIRTHDAY: 18-Oct-1925  Facility:  Nursing Home Location:  [place] Room Number: [id number]  LEVEL OF CARE:  SNF (31)  Contact Information    Name Relation Home Work Mobile   [name],[name] Daughter [phone]     Marisa Cyphers   [phone]   Thornell Mule [phone]         Code Status History    Date Active Date Inactive Code Status Order ID Comments User Context   12/09/2015 10:51 PM 12/17/2015  5:52 PM Full Code [id number]  Rod Can, MD Inpatient   12/09/2015 10:51 PM 12/09/2015 10:51 PM Full Code [id number]  Thurnell Lose, MD Inpatient       Chief Complaint  Patient presents with  . Medical Management of Chronic Issues    HISTORY OF PRESENT ILLNESS:  This is an 82 year old female who is being seen for a routine visit. She had just finished Levaquin course for URI. Latest K 3.4, low and is currently taking Torsemide 20 mg PO Q D.   She has been admitted to [facility] on 12/17/15 from [facility]. She has PMH of chronic kidney disease stage IV, hypertension, diabetes mellitus, COPD, asthma and chronic respiratory failure on 2 L at HS. She is visiting from [place] and had a fall sustaining right distal femur fracture S/P ORIF. She developed pulmonary edema due to fluid resuscitation and was started on IV Lasix and now transitioned to PO Lasix.   PAST MEDICAL HISTORY:  Past Medical History:  Diagnosis Date  . AKI (acute kidney injury) ([place])   . Anemia   . Arthritis   . Asthma   . Cancer [facility])    breast - left  . Chronic kidney disease (CKD), stage IV (severe) ([place])   . COPD (chronic obstructive pulmonary disease) ([place])   . Diabetes mellitus without complication ([place])    Type II  . Dyslipidemia   . Elevated troponin   . Fall 12/2015  . Fracture of femoral neck, closed ([place]) 01/2016   Right, due to mechanical fall  . Hypertension      CURRENT MEDICATIONS: Reviewed  Patient's Medications  New Prescriptions   No medications on file  Previous Medications   ACETAMINOPHEN (TYLENOL) 325 MG TABLET    Take 650 mg by mouth every 4 (four) hours as needed for fever.    ANASTROZOLE (ARIMIDEX) 1 MG TABLET    Take 1 mg by mouth daily.    ATORVASTATIN (LIPITOR) 10 MG TABLET    Take 10 mg by mouth daily.   AZITHROMYCIN (ZITHROMAX) 500 MG TABLET    Take by mouth daily. Take 500 mg tablet on 1st day, then 250 mg, 1 tab qd x5 days   B COMPLEX VITAMINS TABLET    Take 1 tablet by mouth daily.   BUDESONIDE (PULMICORT) 0.25 MG/2ML NEBULIZER SOLUTION    Take 0.25 mg by nebulization daily.    CLOPIDOGREL (PLAVIX) 75 MG TABLET    Take 75 mg by mouth daily.    FERROUS SULFATE (IRON) 325 (65 FE) MG TABS    Take 325 mg by mouth daily.   FORMOTEROL (PERFOROMIST) 20 MCG/2ML NEBULIZER SOLUTION    Take 20 mcg by nebulization daily.    GARLIC 123XX123 MG CAPS    Take 1,000 mg by mouth daily.  GUAIFENESIN (MUCINEX) 600 MG 12 HR TABLET    Take 600 mg by mouth 2 (two) times daily. Take for 1 week   GUAIFENESIN-DEXTROMETHORPHAN (ROBITUSSIN DM) 100-10 MG/5ML SYRUP    Take 10 mLs by mouth every 6 (six) hours as needed for cough.   HYDROCODONE-ACETAMINOPHEN (NORCO/VICODIN) 5-325 MG TABLET    Take 1-2 tablets by mouth every 6 (six) hours as needed for moderate pain.   INSULIN GLARGINE (TOUJEO SOLOSTAR) 300 UNIT/ML SOPN    Inject 15 Units into the skin daily.    IPRATROPIUM-ALBUTEROL (DUONEB) 0.5-2.5 (3) MG/3ML SOLN    Take 3 mLs by nebulization every 6 (six) hours as needed.   LABETALOL (NORMODYNE) 100 MG TABLET    Take 100 mg by mouth 2 (two) times daily.    MENTHOL, TOPICAL ANALGESIC, (BIOFREEZE) 4 % GEL    Apply 1 application topically 3 (three) times daily as needed. Apply to shoulder each shift PRN   MULTIPLE VITAMINS-MINERALS (OCUTABS) TABS    Take 1 tablet by mouth daily.   NIFEDIPINE (PROCARDIA-XL/ADALAT  CC) 30 MG 24 HR TABLET    Take 30 mg by mouth 2 (two) times daily.    NITROGLYCERIN (NITROSTAT) 0.4 MG SL TABLET    Place 0.4 mg under the tongue every 5 (five) minutes as needed for chest pain.   OXYGEN    Inhale 2 L/min into the lungs as needed (To maintain O2 sats >92%).   POLYVINYL ALCOHOL-POVIDONE (REFRESH OP)    Place 1 drop into both eyes 2 (two) times daily.    POTASSIUM CHLORIDE (K-DUR,KLOR-CON) 10 MEQ TABLET    Take 10 mEq by mouth daily.   SAXAGLIPTIN HCL (ONGLYZA) 2.5 MG TABS TABLET    Take 2.5 mg by mouth daily.    SENNA-DOCUSATE (SENOKOT-S) 8.6-50 MG TABLET    Take 1 tablet by mouth 2 (two) times daily.   TORSEMIDE (DEMADEX) 20 MG TABLET    Take 20 mg by mouth daily.    TRAMADOL (ULTRAM) 50 MG TABLET    Take 1 tablet (50 mg total) by mouth every 6 (six) hours as needed for moderate pain.   VENTOLIN HFA 108 (90 BASE) MCG/ACT INHALER    Inhale 2 puffs into the lungs every 4 (four) hours as needed for wheezing or shortness of breath.   Modified Medications   No medications on file  Discontinued Medications   POTASSIUM CHLORIDE SA (K-DUR,KLOR-CON) 20 MEQ TABLET    Take 20 mEq by mouth daily.     Allergies  Allergen Reactions  . Sulfa Antibiotics Other (See Comments)    Reaction unknown  . Tape Other (See Comments)    Blisters  . Penicillins Rash    Has patient had a PCN reaction causing immediate rash, facial/tongue/throat swelling, SOB or lightheadedness with hypotension: yes Has patient had a PCN reaction causing severe rash involving mucus membranes or skin necrosis: no Has patient had a PCN reaction that required hospitalization yes as a teenager Has patient had a PCN reaction occurring within the last 10 years: no was a teenager If all of the above answers are "NO", then may proceed with Cephalosporin use.      REVIEW OF SYSTEMS:  GENERAL: no change in appetite, no fatigue, no weight changes, no fever, chills or weakness EYES: Denies change in vision, dry eyes, eye  pain, itching or discharge EARS: Denies change in hearing, ringing in ears, or earache NOSE: Denies nasal congestion or epistaxis MOUTH and THROAT: Denies oral discomfort, gingival pain or  bleeding, pain from teeth or hoarseness   RESPIRATORY: no cough, SOB, DOE, wheezing, hemoptysis CARDIAC: no chest pain, edema or palpitations GI: no abdominal pain, diarrhea, constipation, heart burn, nausea or vomiting GU: Denies dysuria, frequency, hematuria, incontinence, or discharge PSYCHIATRIC: Denies feeling of depression or anxiety. No report of hallucinations, insomnia, paranoia, or agitation     PHYSICAL EXAMINATION  GENERAL APPEARANCE: Well nourished. In no acute distress. Obese SKIN:  Right femur surgical incision is healed HEAD: Normal in size and contour. No evidence of trauma EYES: Lids open and close normally. No blepharitis, entropion or ectropion. PERRL. Conjunctivae are clear and sclerae are white. Lenses are without opacity EARS: Pinnae are normal. Patient hears normal voice tunes of the examiner MOUTH and THROAT: Lips are without lesions. Oral mucosa is moist and without lesions. Tongue is normal in shape, size, and color and without lesions NECK: supple, trachea midline, no neck masses, no thyroid tenderness, no thyromegaly LYMPHATICS: no LAN in the neck, no supraclavicular LAN RESPIRATORY: breathing is even & unlabored, BS CTAB CARDIAC: RRR, no murmur,no extra heart sounds, BLE 2+ edema GI: abdomen soft, normal BS, no masses, no tenderness, no hepatomegaly, no splenomegaly EXTREMITIES:   Able to move 4 extremities PSYCHIATRIC: Alert and oriented X 3. Affect and behavior are appropriate  LABS/RADIOLOGY: Labs reviewed: Basic Metabolic Panel:  Recent Labs  12/12/15 0401 12/13/15 1016  12/15/15 0400 12/16/15 0550 12/17/15 0510  01/22/16 01/30/16   NA 136 138  < > 139 141 139  < > 144 146   K 4.8 4.3  < > 3.7 3.3* 4.0  < > 4.2 3.4   CL 107 109  < > 109 103 104  --   --    --    CO2 22 22  < > 24 27 27   --   --   --    GLUCOSE 149* 217*  < > 180* 183* 146*  --   --   --    BUN 51* 48*  < > 48* 54* 59*  < > 31* 31*   CREATININE 2.61* 2.45*  < > 2.48* 2.79* 2.81*  < > 2.1* 2.3*   CALCIUM 8.2* 8.4*  < > 9.0 9.3 8.9  --   --   --    PHOS 4.3 3.6  --   --   --   --   --   --   --    < > = values in this interval not displayed. Liver Function Tests:  Recent Labs  12/12/15 0401 12/13/15 1016 12/16/15 0550  AST  --   --  17  ALT  --   --  32  ALKPHOS  --   --  77  BILITOT  --   --  0.8  PROT  --   --  5.5*  ALBUMIN 2.3* 2.2* 2.6*    Recent Labs  12/14/15 1118 12/15/15 0400 12/16/15 0550 12/19/15 1108 01/14/16 01/22/16  WBC 5.1 4.8 5.9 5.6 6.3 7.1  NEUTROABS  --  2.9  --  4 4  --   HGB 9.9* 9.5* 10.5* 9.5* 8.1* 8.2*  HCT 31.3* 30.3* 32.5* 30* 27* 27*  MCV 91.0 90.7 92.1  --   --   --   PLT 148* 169 214 238 117* 182    Cardiac Enzymes:  Recent Labs  12/12/15 0401 12/14/15 1436 12/14/15 1956 12/16/15 [place]*  --   --   --   TROPONINI  --  0.16* 0.18* 0.08*   CBG:  Recent Labs  12/16/15 2206 12/17/15 0746 12/17/15 1156  GLUCAP 239* 273* 279*       ASSESSMENT/PLAN:  Unsteady gait - continue rehabilitation, PT and OT , for therapeutic strengthening exercises; fall precaution  Right femur fracture S/P ORIF - continue rehabilitation, PT and OT, for therapeutic strengthening exercises ; continue Norco 5/325 mg 1-2 tabs by mouth every 6 hours when necessary and tramadol 50 mg 1 tab by mouth every 6 hours when necessary for pain; follow-up with orthopedic surgeon, Dr. Rod Can  CHF -  No SOB; recently changed to  Torsemide 20 mg 1 tab PO Q D  Chronic renal failure stage IV - stable; will monitor  Hypertension - continue labetalol 100 mg 1 tab by mouth twice a day, nifedipine ER 30 mg 1 tab by mouth twice a day  Constipation - continue senna S 1 tab by mouth twice a day  Hyperlipidemia - continue Lipitor 10 mg 1  tab by mouth daily ; check lipid panel  Anemia of chronic disease - stable;  continue Iron 325 mg 1 tab by mouth daily Lab Results  Component Value Date   HGB 8.2 (A) 01/22/2016    Diabetes mellitus, type II with renal complications - recently increased Toujeo 300 units/mL to 13 units subcutaneous daily, Onglyza 2.5 mg 1 tab by mouth daily Lab Results  Component Value Date   HGBA1C 6.3 (H) 12/09/2015    Hx of Left breast cancer - continue Anastrozole 1 mg 1 tab by mouth daily  CAD S/P stent placement in 2009 - stable; continue Plavix 75 mg 1 tab by mouth daily and NTG PRN  COPD - no SOB; continue Pulmicort 0.25 mg/2 mL take 0.25 mg via nebulization daily, Performist 20 mcg/2 ml via nebulization daily, Ventolin HFA 90 mcg inhale 2 puffs into lungs Q 4 hours PRN  Hypokalemia - K 3.4; continue Klor-Con 20 meq daily; BMP in 1 week  Hyperlipidemia - continue Lipitor 10 mg 1 tab  and Omega 3 fish oil 1 capsule PO BID Lab Results  Component Value Date   CHOL 116 12/19/2015   HDL 31 (A) 12/19/2015   LDLCALC 63 12/19/2015   TRIG 113 12/19/2015    Left shoulder Pain -  continue Biofreeze gel 4% topically to left shoulder Q shift     Goals of care:  Short-term rehabilitation     Durenda Age, NP [place] [phone]

## 2016-02-09 ENCOUNTER — Non-Acute Institutional Stay (SKILLED_NURSING_FACILITY): Payer: Medicare Other | Admitting: Adult Health

## 2016-02-09 ENCOUNTER — Encounter: Payer: Self-pay | Admitting: Adult Health

## 2016-02-09 DIAGNOSIS — Z794 Long term (current) use of insulin: Secondary | ICD-10-CM | POA: Diagnosis not present

## 2016-02-09 DIAGNOSIS — R059 Cough, unspecified: Secondary | ICD-10-CM

## 2016-02-09 DIAGNOSIS — J42 Unspecified chronic bronchitis: Secondary | ICD-10-CM | POA: Diagnosis not present

## 2016-02-09 DIAGNOSIS — R05 Cough: Secondary | ICD-10-CM

## 2016-02-09 DIAGNOSIS — E118 Type 2 diabetes mellitus with unspecified complications: Secondary | ICD-10-CM | POA: Diagnosis not present

## 2016-02-09 NOTE — Progress Notes (Signed)
Patient ID: [name], female   DOB: 1925-07-09, 82 y.o.   MRN: [id number]    DATE:  02/09/16  MRN:  [id number]  BIRTHDAY: [birth year]  Facility:  Nursing Home Location:  [place] Room Number: [id number]  LEVEL OF CARE:  SNF (31)  Contact Information    Name Relation Home Work Mobile   [name],[name] Daughter [phone]     Marisa Cyphers   [phone]   Thornell Mule [phone]         Code Status History    Date Active Date Inactive Code Status Order ID Comments User Context   12/09/2015 10:51 PM 12/17/2015  5:52 PM Full Code [id number]  Rod Can, MD Inpatient   12/09/2015 10:51 PM 12/09/2015 10:51 PM Full Code [id number]  Thurnell Lose, MD Inpatient       Chief Complaint  Patient presents with  . Acute Visit    Diabetes management, cough    HISTORY OF PRESENT ILLNESS:  This is an 82 year old female who was noted to have CBGs in the 200s . She is currently taking Toujeo 13 units daily. Patient requested that her medication be adjusted since @ home she "never had CBGs in the 200s." She has been having productive cough with yellowish greenish phlegm. She had just finished Levaquin course X 7 days for URI. She reports that it usually takes her 2 rounds of ATB to get over and infection and that she doesn't feel well.  She has been admitted to [facility] on 12/17/15 from [facility]. She has PMH of chronic kidney disease stage IV, hypertension, diabetes mellitus, COPD, asthma and chronic respiratory failure on 2 L at HS. She is visiting from [place] and had a fall sustaining right distal femur fracture S/P ORIF. She developed pulmonary edema due to fluid resuscitation and was started on IV Lasix and now transitioned to PO Lasix.   PAST MEDICAL HISTORY:  Past Medical History:  Diagnosis Date  . AKI (acute kidney injury) ([place])   . Anemia   . Arthritis   . Asthma   . Cancer [facility])    breast - left  . Chronic kidney  disease (CKD), stage IV (severe) ([place])   . COPD (chronic obstructive pulmonary disease) ([place])   . Diabetes mellitus without complication ([place])    Type II  . Dyslipidemia   . Elevated troponin   . Fall 12/2015  . Fracture of femoral neck, closed ([place]) 01/2016   Right, due to mechanical fall  . Hypertension      CURRENT MEDICATIONS: Reviewed  Patient's Medications  New Prescriptions   No medications on file  Previous Medications   ACETAMINOPHEN (TYLENOL) 325 MG TABLET    Take 650 mg by mouth every 4 (four) hours as needed for fever.    ANASTROZOLE (ARIMIDEX) 1 MG TABLET    Take 1 mg by mouth daily.    ATORVASTATIN (LIPITOR) 10 MG TABLET    Take 10 mg by mouth daily.   AZITHROMYCIN (ZITHROMAX) 500 MG TABLET    Take by mouth daily. Take 500 mg tablet on 1st day, then 250 mg, 1 tab qd x5 days   B COMPLEX VITAMINS TABLET    Take 1 tablet by mouth daily.   BUDESONIDE (PULMICORT) 0.25 MG/2ML NEBULIZER SOLUTION    Take 0.25 mg by nebulization daily.    CLOPIDOGREL (PLAVIX) 75 MG TABLET    Take 75 mg by mouth daily.    FERROUS SULFATE (IRON) 325 (  65 FE) MG TABS    Take 325 mg by mouth daily.   FORMOTEROL (PERFOROMIST) 20 MCG/2ML NEBULIZER SOLUTION    Take 20 mcg by nebulization daily.    GARLIC 123XX123 MG CAPS    Take 1,000 mg by mouth daily.   GUAIFENESIN (MUCINEX) 600 MG 12 HR TABLET    Take 600 mg by mouth 2 (two) times daily. Take for 1 week   GUAIFENESIN-DEXTROMETHORPHAN (ROBITUSSIN DM) 100-10 MG/5ML SYRUP    Take 10 mLs by mouth every 6 (six) hours as needed for cough.   HYDROCODONE-ACETAMINOPHEN (NORCO/VICODIN) 5-325 MG TABLET    Take 1-2 tablets by mouth every 6 (six) hours as needed for moderate pain.   INSULIN GLARGINE (TOUJEO SOLOSTAR) 300 UNIT/ML SOPN    Inject 15 Units into the skin daily.    IPRATROPIUM-ALBUTEROL (DUONEB) 0.5-2.5 (3) MG/3ML SOLN    Take 3 mLs by nebulization every 6 (six) hours as needed.   LABETALOL (NORMODYNE) 100 MG TABLET    Take 100 mg by mouth 2 (two) times  daily.    MENTHOL, TOPICAL ANALGESIC, (BIOFREEZE) 4 % GEL    Apply 1 application topically 3 (three) times daily as needed. Apply to shoulder each shift PRN   MULTIPLE VITAMINS-MINERALS (OCUTABS) TABS    Take 1 tablet by mouth daily.   NIFEDIPINE (PROCARDIA-XL/ADALAT CC) 30 MG 24 HR TABLET    Take 30 mg by mouth 2 (two) times daily.    NITROGLYCERIN (NITROSTAT) 0.4 MG SL TABLET    Place 0.4 mg under the tongue every 5 (five) minutes as needed for chest pain.   OXYGEN    Inhale 2 L/min into the lungs as needed (To maintain O2 sats >92%).   POLYVINYL ALCOHOL-POVIDONE (REFRESH OP)    Place 1 drop into both eyes 2 (two) times daily.    POTASSIUM CHLORIDE (K-DUR,KLOR-CON) 10 MEQ TABLET    Take 10 mEq by mouth daily.   SAXAGLIPTIN HCL (ONGLYZA) 2.5 MG TABS TABLET    Take 2.5 mg by mouth daily.    SENNA-DOCUSATE (SENOKOT-S) 8.6-50 MG TABLET    Take 1 tablet by mouth 2 (two) times daily.   TORSEMIDE (DEMADEX) 20 MG TABLET    Take 20 mg by mouth daily.    TRAMADOL (ULTRAM) 50 MG TABLET    Take 1 tablet (50 mg total) by mouth every 6 (six) hours as needed for moderate pain.   VENTOLIN HFA 108 (90 BASE) MCG/ACT INHALER    Inhale 2 puffs into the lungs every 4 (four) hours as needed for wheezing or shortness of breath.   Modified Medications   No medications on file  Discontinued Medications   POTASSIUM CHLORIDE SA (K-DUR,KLOR-CON) 20 MEQ TABLET    Take 20 mEq by mouth daily.     Allergies  Allergen Reactions  . Sulfa Antibiotics Other (See Comments)    Reaction unknown  . Tape Other (See Comments)    Blisters  . Penicillins Rash    Has patient had a PCN reaction causing immediate rash, facial/tongue/throat swelling, SOB or lightheadedness with hypotension: yes Has patient had a PCN reaction causing severe rash involving mucus membranes or skin necrosis: no Has patient had a PCN reaction that required hospitalization yes as a teenager Has patient had a PCN reaction occurring within the last 10  years: no was a teenager If all of the above answers are "NO", then may proceed with Cephalosporin use.      REVIEW OF SYSTEMS:  GENERAL: no change in  appetite, no fatigue, no weight changes, no fever, chills or weakness EYES: Denies change in vision, dry eyes, eye pain, itching or discharge EARS: Denies change in hearing, ringing in ears, or earache NOSE: Denies nasal congestion or epistaxis MOUTH and THROAT: Denies oral discomfort, gingival pain or bleeding, pain from teeth or hoarseness   RESPIRATORY: no SOB, DOE, wheezing, hemoptysis, +cough CARDIAC: no chest pain, edema or palpitations GI: no abdominal pain, diarrhea, constipation, heart burn, nausea or vomiting GU: Denies dysuria, frequency, hematuria, incontinence, or discharge PSYCHIATRIC: Denies feeling of depression or anxiety. No report of hallucinations, insomnia, paranoia, or agitation     PHYSICAL EXAMINATION  GENERAL APPEARANCE: Well nourished. In no acute distress. Normal body habitus SKIN:  Right femur surgical incision is healed HEAD: Normal in size and contour. No evidence of trauma EYES: Lids open and close normally. No blepharitis, entropion or ectropion. PERRL. Conjunctivae are clear and sclerae are white. Lenses are without opacity EARS: Pinnae are normal. Patient hears normal voice tunes of the examiner MOUTH and THROAT: Lips are without lesions. Oral mucosa is moist and without lesions. Tongue is normal in shape, size, and color and without lesions NECK: supple, trachea midline, no neck masses, no thyroid tenderness, no thyromegaly LYMPHATICS: no LAN in the neck, no supraclavicular LAN RESPIRATORY: breathing is even & unlabored, BS CTAB CARDIAC: RRR, no murmur,no extra heart sounds, no edema GI: abdomen soft, normal BS, no masses, no tenderness, no hepatomegaly, no splenomegaly EXTREMITIES:   Able to move 4 extremities PSYCHIATRIC: Alert and oriented X 3. Affect and behavior are  appropriate  LABS/RADIOLOGY: Labs reviewed: Basic Metabolic Panel:  Recent Labs  12/12/15 0401 12/13/15 1016  12/15/15 0400 12/16/15 0550 12/17/15 0510  01/22/16 01/30/16 02/02/16  NA 136 138  < > 139 141 139  < > 144 146 146  K 4.8 4.3  < > 3.7 3.3* 4.0  < > 4.2 3.4 3.9  CL 107 109  < > 109 103 104  --   --   --   --   CO2 22 22  < > 24 27 27   --   --   --   --   GLUCOSE 149* 217*  < > 180* 183* 146*  --   --   --   --   BUN 51* 48*  < > 48* 54* 59*  < > 31* 31* 28*  CREATININE 2.61* 2.45*  < > 2.48* 2.79* 2.81*  < > 2.1* 2.3* 2.0*  CALCIUM 8.2* 8.4*  < > 9.0 9.3 8.9  --   --   --   --   PHOS 4.3 3.6  --   --   --   --   --   --   --   --   < > = values in this interval not displayed. Liver Function Tests:  Recent Labs  12/12/15 0401 12/13/15 1016 12/16/15 0550  AST  --   --  17  ALT  --   --  32  ALKPHOS  --   --  77  BILITOT  --   --  0.8  PROT  --   --  5.5*  ALBUMIN 2.3* 2.2* 2.6*    Recent Labs  12/14/15 1118 12/15/15 0400 12/16/15 0550 12/19/15 1108 01/14/16 01/22/16  WBC 5.1 4.8 5.9 5.6 6.3 7.1  NEUTROABS  --  2.9  --  4 4  --   HGB 9.9* 9.5* 10.5* 9.5* 8.1* 8.2*  HCT 31.3* 30.3*  32.5* 30* 27* 27*  MCV 91.0 90.7 92.1  --   --   --   PLT 148* 169 214 238 117* 182    Cardiac Enzymes:  Recent Labs  12/12/15 0401 12/14/15 1436 12/14/15 1956 12/16/15 0550  CKTOTAL 253*  --   --   --   TROPONINI  --  0.16* 0.18* 0.08*   CBG:  Recent Labs  12/16/15 2206 12/17/15 0746 12/17/15 1156  GLUCAP 239* 273* 279*       ASSESSMENT/PLAN:  Chronic Bronchitis - start Azithromycin500 mg 1 tab by mouth 1 on first day then 250 mg 1 tab by mouth daily 5 days  Cough - start Mucinex 600 mg 1 tab by mouth every 12 hours 1 week  Diabetes mellitus, type II - CBGs has been in the 200s, discontinue Toujeo 13 units; start Tuojeo 15 units subcutaneous daily Lab Results  Component Value Date   HGBA1C 6.3 (H) 12/09/2015       Durenda Age,  NP [employer] [phone]

## 2016-03-01 LAB — BASIC METABOLIC PANEL
BUN: 30 mg/dL — AB (ref 4–21)
Creatinine: 1.8 mg/dL — AB (ref 0.5–1.1)
GLUCOSE: 155 mg/dL
Potassium: 4.2 mmol/L (ref 3.4–5.3)
SODIUM: 146 mmol/L (ref 137–147)

## 2016-03-11 LAB — BASIC METABOLIC PANEL
BUN: 31 mg/dL — AB (ref 4–21)
Creatinine: 1.5 mg/dL — AB (ref 0.5–1.1)
GLUCOSE: 149 mg/dL
Potassium: 4.2 mmol/L (ref 3.4–5.3)
Sodium: 145 mmol/L (ref 137–147)

## 2016-03-15 ENCOUNTER — Encounter: Payer: Self-pay | Admitting: Adult Health

## 2016-03-15 ENCOUNTER — Non-Acute Institutional Stay (SKILLED_NURSING_FACILITY): Payer: Medicare Other | Admitting: Adult Health

## 2016-03-15 DIAGNOSIS — I10 Essential (primary) hypertension: Secondary | ICD-10-CM

## 2016-03-15 DIAGNOSIS — K5901 Slow transit constipation: Secondary | ICD-10-CM | POA: Diagnosis not present

## 2016-03-15 DIAGNOSIS — E118 Type 2 diabetes mellitus with unspecified complications: Secondary | ICD-10-CM | POA: Diagnosis not present

## 2016-03-15 DIAGNOSIS — Z853 Personal history of malignant neoplasm of breast: Secondary | ICD-10-CM | POA: Diagnosis not present

## 2016-03-15 DIAGNOSIS — R2681 Unsteadiness on feet: Secondary | ICD-10-CM

## 2016-03-15 DIAGNOSIS — Z794 Long term (current) use of insulin: Secondary | ICD-10-CM

## 2016-03-15 DIAGNOSIS — I509 Heart failure, unspecified: Secondary | ICD-10-CM | POA: Diagnosis not present

## 2016-03-15 DIAGNOSIS — J9611 Chronic respiratory failure with hypoxia: Secondary | ICD-10-CM

## 2016-03-15 DIAGNOSIS — E785 Hyperlipidemia, unspecified: Secondary | ICD-10-CM

## 2016-03-15 DIAGNOSIS — R918 Other nonspecific abnormal finding of lung field: Secondary | ICD-10-CM

## 2016-03-15 DIAGNOSIS — E119 Type 2 diabetes mellitus without complications: Secondary | ICD-10-CM

## 2016-03-15 DIAGNOSIS — S72001S Fracture of unspecified part of neck of right femur, sequela: Secondary | ICD-10-CM

## 2016-03-15 DIAGNOSIS — M25512 Pain in left shoulder: Secondary | ICD-10-CM | POA: Diagnosis not present

## 2016-03-15 DIAGNOSIS — E876 Hypokalemia: Secondary | ICD-10-CM

## 2016-03-15 DIAGNOSIS — I2510 Atherosclerotic heart disease of native coronary artery without angina pectoris: Secondary | ICD-10-CM

## 2016-03-15 DIAGNOSIS — N184 Chronic kidney disease, stage 4 (severe): Secondary | ICD-10-CM

## 2016-03-15 NOTE — Progress Notes (Signed)
Patient ID: [name], female   DOB: 28-Feb-1926, 82 y.o.   MRN: [id number]    DATE:    03/15/16    MRN:  [id number]  BIRTHDAY: [birth year]  Facility:  Nursing Home Location:  [place] Room Number: [id number]  LEVEL OF CARE:  SNF (31)  Contact Information    Name Relation Home Work Mobile   [name],[name] Daughter [phone]     Marisa Cyphers   [phone]   Thornell Mule [phone]         Code Status History    Date Active Date Inactive Code Status Order ID Comments User Context   12/09/2015 10:51 PM 12/17/2015  5:52 PM Full Code [id number]  Rod Can, MD Inpatient   12/09/2015 10:51 PM 12/09/2015 10:51 PM Full Code [id number]  Thurnell Lose, MD Inpatient       Chief Complaint  Patient presents with  . Medical Management of Chronic Issues    HISTORY OF PRESENT ILLNESS:  This is an 82 year old female who is being seen for a routine visit. She was seen in her room with friends visiting. Recent CXR showed a suspected mass @ right lower lobe. Patient verbalized that this is something new. She has no SOB but she continues to use O2 @ 2L/min. No wheezing has been noted upon auscultation. She had just finished 2 courses of ATB - Levaquin and Azithromycin.   She has been admitted to [facility] on 12/17/15 [facility] She has PMH of chronic kidney disease stage IV, hypertension, diabetes mellitus, COPD, asthma and chronic respiratory failure on 2 L at HS. She is visiting from [place] and had a fall sustaining right distal femur fracture S/P ORIF. She developed pulmonary edema due to fluid resuscitation and was started on IV Lasix and now transitioned to PO Lasix.  She has been shifted from Lasix to Torsemide due to CKD.  PAST MEDICAL HISTORY:  Past Medical History:  Diagnosis Date  . AKI (acute kidney injury) ([place])   . Anemia   . Arthritis   . Asthma   . Cancer [facility])    breast - left  . Chronic kidney disease  (CKD), stage IV (severe) ([place])   . COPD (chronic obstructive pulmonary disease) ([place])   . Diabetes mellitus without complication ([place])    Type II  . Dyslipidemia   . Elevated troponin   . Fall 12/2015  . Fracture of femoral neck, closed ([place]) 01/2016   Right, due to mechanical fall  . Hypertension      CURRENT MEDICATIONS: Reviewed  Patient's Medications  New Prescriptions   No medications on file  Previous Medications   ACETAMINOPHEN (TYLENOL) 325 MG TABLET    Take 650 mg by mouth every 4 (four) hours as needed for fever.    ANASTROZOLE (ARIMIDEX) 1 MG TABLET    Take 1 mg by mouth daily.    ATORVASTATIN (LIPITOR) 10 MG TABLET    Take 10 mg by mouth daily.   B COMPLEX VITAMINS TABLET    Take 1 tablet by mouth daily.   BUDESONIDE (PULMICORT) 0.25 MG/2ML NEBULIZER SOLUTION    Take 0.25 mg by nebulization daily.    CLOPIDOGREL (PLAVIX) 75 MG TABLET    Take 75 mg by mouth daily.    FERROUS SULFATE (IRON) 325 (65 FE) MG TABS    Take 325 mg by mouth daily.   FORMOTEROL (PERFOROMIST) 20 MCG/2ML NEBULIZER SOLUTION    Take 20 mcg by nebulization  daily.    GARLIC 123XX123 MG CAPS    Take 1,000 mg by mouth daily.   GUAIFENESIN (MUCINEX) 600 MG 12 HR TABLET    Take 600 mg by mouth 2 (two) times daily. Take for 2 weeks   GUAIFENESIN-DEXTROMETHORPHAN (ROBITUSSIN DM) 100-10 MG/5ML SYRUP    Take 10 mLs by mouth every 6 (six) hours as needed for cough.   HYDROCODONE-ACETAMINOPHEN (NORCO/VICODIN) 5-325 MG TABLET    Take 1-2 tablets by mouth every 6 (six) hours as needed for moderate pain.   INSULIN GLARGINE (TOUJEO SOLOSTAR) 300 UNIT/ML SOPN    Inject 15 Units into the skin daily.    IPRATROPIUM-ALBUTEROL (DUONEB) 0.5-2.5 (3) MG/3ML SOLN    Take 3 mLs by nebulization every 6 (six) hours as needed.   LABETALOL (NORMODYNE) 100 MG TABLET    Take 100 mg by mouth 2 (two) times daily.    MENTHOL, TOPICAL ANALGESIC, (BIOFREEZE) 4 % GEL    Apply 1 application topically 3 (three) times daily as needed. Apply to  shoulder each shift PRN   MULTIPLE VITAMINS-MINERALS (OCUTABS) TABS    Take 1 tablet by mouth daily.   NIFEDIPINE (PROCARDIA-XL/ADALAT CC) 30 MG 24 HR TABLET    Take 30 mg by mouth 2 (two) times daily.    NITROGLYCERIN (NITROSTAT) 0.4 MG SL TABLET    Place 0.4 mg under the tongue every 5 (five) minutes as needed for chest pain.   OXYGEN    Inhale 2 L/min into the lungs as needed (To maintain O2 sats >92%).   POLYVINYL ALCOHOL-POVIDONE (REFRESH OP)    Place 1 drop into both eyes 2 (two) times daily.    POTASSIUM CHLORIDE (K-DUR,KLOR-CON) 10 MEQ TABLET    Take 10 mEq by mouth daily.   SAXAGLIPTIN HCL (ONGLYZA) 2.5 MG TABS TABLET    Take 2.5 mg by mouth daily.    SENNA-DOCUSATE (SENOKOT-S) 8.6-50 MG TABLET    Take 1 tablet by mouth 2 (two) times daily.   TORSEMIDE (DEMADEX) 20 MG TABLET    Take 20 mg by mouth daily.    TRAMADOL (ULTRAM) 50 MG TABLET    Take 1 tablet (50 mg total) by mouth every 6 (six) hours as needed for moderate pain.   VENTOLIN HFA 108 (90 BASE) MCG/ACT INHALER    Inhale 2 puffs into the lungs every 4 (four) hours as needed for wheezing or shortness of breath.   Modified Medications   No medications on file  Discontinued Medications   AZITHROMYCIN (ZITHROMAX) 500 MG TABLET    Take by mouth daily. Take 500 mg tablet on 1st day, then 250 mg, 1 tab qd x5 days     Allergies  Allergen Reactions  . Sulfa Antibiotics Other (See Comments)    Reaction unknown  . Tape Other (See Comments)    Blisters  . Penicillins Rash    Has patient had a PCN reaction causing immediate rash, facial/tongue/throat swelling, SOB or lightheadedness with hypotension: yes Has patient had a PCN reaction causing severe rash involving mucus membranes or skin necrosis: no Has patient had a PCN reaction that required hospitalization yes as a teenager Has patient had a PCN reaction occurring within the last 10 years: no was a teenager If all of the above answers are "NO", then may proceed with  Cephalosporin use.      REVIEW OF SYSTEMS:  GENERAL: no change in appetite, no fatigue, no weight changes, no fever, chills or weakness EYES: Denies change in vision, dry eyes,  eye pain, itching or discharge EARS: Denies change in hearing, ringing in ears, or earache NOSE: Denies nasal congestion or epistaxis MOUTH and THROAT: Denies oral discomfort, gingival pain or bleeding, pain from teeth or hoarseness   RESPIRATORY: no cough, SOB, DOE, wheezing, hemoptysis CARDIAC: no chest pain, or palpitations GI: no abdominal pain, diarrhea, constipation, heart burn, nausea or vomiting GU: Denies dysuria, frequency, hematuria, incontinence, or discharge PSYCHIATRIC: Denies feeling of depression or anxiety. No report of hallucinations, insomnia, paranoia, or agitation     PHYSICAL EXAMINATION  GENERAL APPEARANCE: Well nourished. In no acute distress. Obese SKIN:  Right femur surgical incision is healed HEAD: Normal in size and contour. No evidence of trauma EYES: Lids open and close normally. No blepharitis, entropion or ectropion. PERRL. Conjunctivae are clear and sclerae are white. Lenses are without opacity EARS: Pinnae are normal. Patient hears normal voice tunes of the examiner MOUTH and THROAT: Lips are without lesions. Oral mucosa is moist and without lesions. Tongue is normal in shape, size, and color and without lesions NECK: supple, trachea midline, no neck masses, no thyroid tenderness, no thyromegaly LYMPHATICS: no LAN in the neck, no supraclavicular LAN RESPIRATORY: breathing is even & unlabored, BS CTAB, on O2 @ 2L/min via [place] continuously CARDIAC: RRR, no murmur,no extra heart sounds, BLE 2+ edema GI: abdomen soft, normal BS, no masses, no tenderness, no hepatomegaly, no splenomegaly EXTREMITIES:   Able to move 4 extremities PSYCHIATRIC: Alert and oriented X 3. Affect and behavior are appropriate  LABS/RADIOLOGY: Labs reviewed: Basic Metabolic Panel:  Recent Labs   12/12/15 0401 12/13/15 1016  12/15/15 0400 12/16/15 0550 12/17/15 0510  01/22/16 01/30/16   NA 136 138  < > 139 141 139  < > 144 146   K 4.8 4.3  < > 3.7 3.3* 4.0  < > 4.2 3.4   CL 107 109  < > 109 103 104  --   --   --    CO2 22 22  < > 24 27 27   --   --   --    GLUCOSE 149* 217*  < > 180* 183* 146*  --   --   --    BUN 51* 48*  < > 48* 54* 59*  < > 31* 31*   CREATININE 2.61* 2.45*  < > 2.48* 2.79* 2.81*  < > 2.1* 2.3*   CALCIUM 8.2* 8.4*  < > 9.0 9.3 8.9  --   --   --    PHOS 4.3 3.6  --   --   --   --   --   --   --    < > = values in this interval not displayed. Liver Function Tests:  Recent Labs  12/12/15 0401 12/13/15 1016 12/16/15 0550  AST  --   --  17  ALT  --   --  32  ALKPHOS  --   --  77  BILITOT  --   --  0.8  PROT  --   --  5.5*  ALBUMIN 2.3* 2.2* 2.6*    Recent Labs  12/14/15 1118 12/15/15 0400 12/16/15 0550 12/19/15 1108 01/14/16 01/22/16  WBC 5.1 4.8 5.9 5.6 6.3 7.1  NEUTROABS  --  2.9  --  4 4  --   HGB 9.9* 9.5* 10.5* 9.5* 8.1* 8.2*  HCT 31.3* 30.3* 32.5* 30* 27* 27*  MCV 91.0 90.7 92.1  --   --   --   PLT 148* 169 214  238 117* 182    Cardiac Enzymes:  Recent Labs  12/12/15 0401 12/14/15 1436 12/14/15 1956 12/16/15 0550  CKTOTAL 253*  --   --   --   TROPONINI  --  0.16* 0.18* 0.08*   CBG:  Recent Labs  12/16/15 2206 12/17/15 0746 12/17/15 1156  GLUCAP 239* 273* 279*       ASSESSMENT/PLAN:  Unsteady gait - continue rehabilitation, PT and OT , for therapeutic strengthening exercises; fall precaution  Right femur fracture S/P ORIF - continue rehabilitation, PT and OT, for therapeutic strengthening exercises ; continue Norco 5/325 mg 1-2 tabs by mouth every 6 hours when necessary and tramadol 50 mg 1 tab by mouth every 6 hours when necessary for pain; follow-up with orthopedic surgeon, Dr. Rod Can  CHF -  No SOB; recently changed to  Torsemide 20 mg 1 tab PO Q D  Chronic respiratory failure - continue O2 @ 2L/min via  [place] continuously, Pulmicort, performist and Ventolin  Chronic renal failure stage IV - GFR  33.79 , stable Lab Results  Component Value Date   CREATININE 1.5 (A) 03/11/2016    Hypertension - continue labetalol 100 mg 1 tab by mouth twice a day, nifedipine ER 30 mg 1 tab by mouth twice a day  Constipation - continue senna S 1 tab by mouth twice a day  Hyperlipidemia - continue Lipitor 10 mg 1 tab by mouth daily ; check lipid panel  Anemia of chronic disease - stable;  continue Iron 325 mg 1 tab by mouth daily Lab Results  Component Value Date   HGB 8.2 (A) 01/22/2016    Diabetes mellitus, type II with renal complications - recently increased Toujeo 300 units/mL to 15 units subcutaneous daily, Onglyza 2.5 mg 1 tab by mouth daily Lab Results  Component Value Date   HGBA1C 6.3 (H) 12/09/2015    Hx of Left breast cancer - continue Anastrozole 1 mg 1 tab by mouth daily  CAD S/P stent placement in 2009 - stable; continue Plavix 75 mg 1 tab by mouth daily and NTG PRN  COPD - no SOB; continue Pulmicort 0.25 mg/2 mL take 0.25 mg via nebulization daily, Performist 20 mcg/2 ml via nebulization daily, Ventolin HFA 90 mcg inhale 2 puffs into lungs Q 4 hours PRN  Hypokalemia -  continue [place]-Con 10 meq daily Lab Results  Component Value Date   K 4.2 03/11/2016    Hyperlipidemia - continue Lipitor 10 mg 1 tab  and Omega 3 fish oil 1 capsule PO BID Lab Results  Component Value Date   CHOL 116 12/19/2015   HDL 31 (A) 12/19/2015   LDLCALC 63 12/19/2015   TRIG 113 12/19/2015    Left shoulder Pain -  continue Biofreeze gel 4% topically to left shoulder Q shift  Suspected mass on RLL - schedule CT scan of chest without contrast and pulmonology consult     Goals of care:  Short-term rehabilitation     Durenda Age, NP [place] [phone]

## 2016-03-18 ENCOUNTER — Non-Acute Institutional Stay (SKILLED_NURSING_FACILITY): Payer: Medicare Other | Admitting: Adult Health

## 2016-03-18 ENCOUNTER — Other Ambulatory Visit: Payer: Self-pay | Admitting: Internal Medicine

## 2016-03-18 ENCOUNTER — Encounter: Payer: Self-pay | Admitting: Adult Health

## 2016-03-18 DIAGNOSIS — M25512 Pain in left shoulder: Secondary | ICD-10-CM | POA: Diagnosis not present

## 2016-03-18 DIAGNOSIS — E119 Type 2 diabetes mellitus without complications: Secondary | ICD-10-CM

## 2016-03-18 DIAGNOSIS — Z853 Personal history of malignant neoplasm of breast: Secondary | ICD-10-CM

## 2016-03-18 DIAGNOSIS — J9611 Chronic respiratory failure with hypoxia: Secondary | ICD-10-CM | POA: Diagnosis not present

## 2016-03-18 DIAGNOSIS — I2510 Atherosclerotic heart disease of native coronary artery without angina pectoris: Secondary | ICD-10-CM

## 2016-03-18 DIAGNOSIS — K5901 Slow transit constipation: Secondary | ICD-10-CM

## 2016-03-18 DIAGNOSIS — R918 Other nonspecific abnormal finding of lung field: Secondary | ICD-10-CM

## 2016-03-18 DIAGNOSIS — E785 Hyperlipidemia, unspecified: Secondary | ICD-10-CM

## 2016-03-18 DIAGNOSIS — Z794 Long term (current) use of insulin: Secondary | ICD-10-CM

## 2016-03-18 DIAGNOSIS — I10 Essential (primary) hypertension: Secondary | ICD-10-CM | POA: Diagnosis not present

## 2016-03-18 DIAGNOSIS — R2681 Unsteadiness on feet: Secondary | ICD-10-CM

## 2016-03-18 DIAGNOSIS — N184 Chronic kidney disease, stage 4 (severe): Secondary | ICD-10-CM | POA: Diagnosis not present

## 2016-03-18 DIAGNOSIS — E876 Hypokalemia: Secondary | ICD-10-CM

## 2016-03-18 DIAGNOSIS — I509 Heart failure, unspecified: Secondary | ICD-10-CM

## 2016-03-18 DIAGNOSIS — E118 Type 2 diabetes mellitus with unspecified complications: Secondary | ICD-10-CM

## 2016-03-18 DIAGNOSIS — D638 Anemia in other chronic diseases classified elsewhere: Secondary | ICD-10-CM

## 2016-03-18 DIAGNOSIS — S72001S Fracture of unspecified part of neck of right femur, sequela: Secondary | ICD-10-CM

## 2016-03-18 NOTE — Progress Notes (Signed)
Patient ID: [name], female   DOB: 05-Jun-1925, 82 y.o.   MRN: [id number]    DATE:    03/18/16    MRN:  [id number]  BIRTHDAY: 11/01/1925  Facility:  Nursing Home Location:  [place] Room Number: [id number]  LEVEL OF CARE:  SNF (31)  Contact Information    Name Relation Home Work Mobile   [name],[name] Daughter [phone]     Marisa Cyphers   [phone]   Thornell Mule [phone]         Code Status History    Date Active Date Inactive Code Status Order ID Comments User Context   12/09/2015 10:51 PM 12/17/2015  5:52 PM Full Code [id number]  Rod Can, MD Inpatient   12/09/2015 10:51 PM 12/09/2015 10:51 PM Full Code [id number]  Thurnell Lose, MD Inpatient       Chief Complaint  Patient presents with  . Discharge Note    HISTORY OF PRESENT ILLNESS:  This is an 82 year old female who is for discharge home with Home health PT, OT, CNA and Nursing. DME:  Standard Wheelchair with elevating leg rests, anti-tippers, cushion, brake extension, semi-electric hospital bed, bedside commode and O2 at 2 L/minute via [place] continuously portable (gas) and stationary.  She is scheduled to have a CT scan of the chest tomorrow due to a recent CXR which showed a suspected mass @ right lower lobe.   She has been admitted to [facility] on 12/17/15 from [facility]. She has PMH of chronic kidney disease stage IV, hypertension, diabetes mellitus, COPD, asthma and chronic respiratory failure on 2 L at HS. She is visiting from [place] and had a fall sustaining right distal femur fracture S/P ORIF. She developed pulmonary edema due to fluid resuscitation and was started on IV Lasix and now transitioned to PO Lasix.  She has been shifted from Lasix to Torsemide due to CKD.  Patient was admitted to this facility for short-term rehabilitation after the patient's recent hospitalization.  Patient has completed SNF rehabilitation and therapy has  cleared the patient for discharge.   PAST MEDICAL HISTORY:  Past Medical History:  Diagnosis Date  . AKI (acute kidney injury) ([place])   . Anemia   . Arthritis   . Asthma   . Cancer [facility])    breast - left  . Chronic kidney disease (CKD), stage IV (severe) ([place])   . COPD (chronic obstructive pulmonary disease) ([place])   . Diabetes mellitus without complication ([place])    Type II  . Dyslipidemia   . Elevated troponin   . Fall 12/2015  . Fracture of femoral neck, closed ([place]) 01/2016   Right, due to mechanical fall  . Hypertension      CURRENT MEDICATIONS: Reviewed  Patient's Medications  New Prescriptions   No medications on file  Previous Medications   ACETAMINOPHEN (TYLENOL) 325 MG TABLET    Take 650 mg by mouth every 4 (four) hours as needed for fever.    ANASTROZOLE (ARIMIDEX) 1 MG TABLET    Take 1 mg by mouth daily.    ATORVASTATIN (LIPITOR) 10 MG TABLET    Take 10 mg by mouth daily.   B COMPLEX VITAMINS TABLET    Take 1 tablet by mouth daily.   BUDESONIDE (PULMICORT) 0.25 MG/2ML NEBULIZER SOLUTION    Take 0.25 mg by nebulization daily.    CLOPIDOGREL (PLAVIX) 75 MG TABLET    Take 75 mg by mouth daily.    FERROUS SULFATE (  IRON) 325 (65 FE) MG TABS    Take 325 mg by mouth daily.   FORMOTEROL (PERFOROMIST) 20 MCG/2ML NEBULIZER SOLUTION    Take 20 mcg by nebulization daily.    GARLIC [id number] MG CAPS    Take 1,000 mg by mouth daily.   GUAIFENESIN (MUCINEX) 600 MG 12 HR TABLET    Take 600 mg by mouth 2 (two) times daily. Take for 2 weeks   GUAIFENESIN-DEXTROMETHORPHAN (ROBITUSSIN DM) 100-10 MG/5ML SYRUP    Take 10 mLs by mouth every 6 (six) hours as needed for cough.   HYDROCODONE-ACETAMINOPHEN (NORCO/VICODIN) 5-325 MG TABLET    Take 1-2 tablets by mouth every 6 (six) hours as needed for moderate pain.   INSULIN GLARGINE (TOUJEO SOLOSTAR) 300 UNIT/ML SOPN    Inject 15 Units into the skin daily.    IPRATROPIUM-ALBUTEROL (DUONEB) 0.5-2.5 (3) MG/3ML SOLN    Take 3 mLs by nebulization  every 6 (six) hours as needed.   LABETALOL (NORMODYNE) 100 MG TABLET    Take 100 mg by mouth 2 (two) times daily.    MENTHOL, TOPICAL ANALGESIC, (BIOFREEZE) 4 % GEL    Apply 1 application topically 3 (three) times daily as needed. Apply to shoulder each shift PRN   MULTIPLE VITAMINS-MINERALS (OCUTABS) TABS    Take 1 tablet by mouth daily.   NIFEDIPINE (PROCARDIA-XL/ADALAT CC) 30 MG 24 HR TABLET    Take 30 mg by mouth 2 (two) times daily.    NITROGLYCERIN (NITROSTAT) 0.4 MG SL TABLET    Place 0.4 mg under the tongue every 5 (five) minutes as needed for chest pain.   OXYGEN    Inhale 2 L/min into the lungs as needed (To maintain O2 sats >92%).   POLYVINYL ALCOHOL-POVIDONE (REFRESH OP)    Place 1 drop into both eyes 2 (two) times daily.    POTASSIUM CHLORIDE (K-DUR,KLOR-CON) 10 MEQ TABLET    Take 10 mEq by mouth daily.   SAXAGLIPTIN HCL (ONGLYZA) 2.5 MG TABS TABLET    Take 2.5 mg by mouth daily.    SENNA-DOCUSATE (SENOKOT-S) 8.6-50 MG TABLET    Take 1 tablet by mouth 2 (two) times daily.   TORSEMIDE (DEMADEX) 20 MG TABLET    Take 20 mg by mouth daily.    TRAMADOL (ULTRAM) 50 MG TABLET    Take 1 tablet (50 mg total) by mouth every 6 (six) hours as needed for moderate pain.   VENTOLIN HFA 108 (90 BASE) MCG/ACT INHALER    Inhale 2 puffs into the lungs every 4 (four) hours as needed for wheezing or shortness of breath.   Modified Medications   No medications on file  Discontinued Medications   No medications on file     Allergies  Allergen Reactions  . Sulfa Antibiotics Other (See Comments)    Reaction unknown  . Tape Other (See Comments)    Blisters  . Penicillins Rash    Has patient had a PCN reaction causing immediate rash, facial/tongue/throat swelling, SOB or lightheadedness with hypotension: yes Has patient had a PCN reaction causing severe rash involving mucus membranes or skin necrosis: no Has patient had a PCN reaction that required hospitalization yes as a teenager Has patient had a  PCN reaction occurring within the last 10 years: no was a teenager If all of the above answers are "NO", then may proceed with Cephalosporin use.      REVIEW OF SYSTEMS:  GENERAL: no change in appetite, no fatigue, no weight changes, no fever, chills or  weakness EYES: Denies change in vision, dry eyes, eye pain, itching or discharge EARS: Denies change in hearing, ringing in ears, or earache NOSE: Denies nasal congestion or epistaxis MOUTH and THROAT: Denies oral discomfort, gingival pain or bleeding, pain from teeth or hoarseness   RESPIRATORY: no cough, SOB, DOE, wheezing, hemoptysis CARDIAC: no chest pain, or palpitations GI: no abdominal pain, diarrhea, constipation, heart burn, nausea or vomiting GU: Denies dysuria, frequency, hematuria, incontinence, or discharge PSYCHIATRIC: Denies feeling of depression or anxiety. No report of hallucinations, insomnia, paranoia, or agitation     PHYSICAL EXAMINATION  GENERAL APPEARANCE: Well nourished. In no acute distress. Obese SKIN:  Right femur surgical incision is healed HEAD: Normal in size and contour. No evidence of trauma EYES: Lids open and close normally. No blepharitis, entropion or ectropion. PERRL. Conjunctivae are clear and sclerae are white. Lenses are without opacity EARS: Pinnae are normal. Patient hears normal voice tunes of the examiner MOUTH and THROAT: Lips are without lesions. Oral mucosa is moist and without lesions. Tongue is normal in shape, size, and color and without lesions NECK: supple, trachea midline, no neck masses, no thyroid tenderness, no thyromegaly LYMPHATICS: no LAN in the neck, no supraclavicular LAN RESPIRATORY: breathing is even & unlabored, BS CTAB, on O2 @ 2L/min via [place] continuously CARDIAC: RRR, no murmur,no extra heart sounds, BLE 2+ edema GI: abdomen soft, normal BS, no masses, no tenderness, no hepatomegaly, no splenomegaly EXTREMITIES:   Able to move 4 extremities PSYCHIATRIC: Alert and  oriented X 3. Affect and behavior are appropriate  LABS/RADIOLOGY: Labs reviewed: Basic Metabolic Panel:  Recent Labs  12/12/15 0401 12/13/15 1016  12/15/15 0400 12/16/15 0550 12/17/15 0510  02/02/16 03/01/16 03/11/16  NA 136 138  < > 139 141 139  < > 146 146 145  K 4.8 4.3  < > 3.7 3.3* 4.0  < > 3.9 4.2 4.2  CL 107 109  < > 109 103 104  --   --   --   --   CO2 22 22  < > 24 27 27   --   --   --   --   GLUCOSE 149* 217*  < > 180* 183* 146*  --   --   --   --   BUN 51* 48*  < > 48* 54* 59*  < > 28* 30* 31*  CREATININE 2.61* 2.45*  < > 2.48* 2.79* 2.81*  < > 2.0* 1.8* 1.5*  CALCIUM 8.2* 8.4*  < > 9.0 9.3 8.9  --   --   --   --   PHOS 4.3 3.6  --   --   --   --   --   --   --   --   < > = values in this interval not displayed. Liver Function Tests:  Recent Labs  12/12/15 0401 12/13/15 1016 12/16/15 0550  AST  --   --  17  ALT  --   --  32  ALKPHOS  --   --  77  BILITOT  --   --  0.8  PROT  --   --  5.5*  ALBUMIN 2.3* 2.2* 2.6*   CBC:  Recent Labs  12/14/15 1118 12/15/15 0400 12/16/15 0550 12/19/15 1108 01/14/16 01/22/16  WBC 5.1 4.8 5.9 5.6 6.3 7.1  NEUTROABS  --  2.9  --  4 4  --   HGB 9.9* 9.5* 10.5* 9.5* 8.1* 8.2*  HCT 31.3* 30.3* 32.5* 30* 27* 27*  MCV 91.0 90.7 92.1  --   --   --   PLT 148* 169 214 238 117* 182   Lipid Panel:  Recent Labs  12/19/15 1108  HDL 31*   Cardiac Enzymes:  Recent Labs  12/12/15 0401 12/14/15 1436 12/14/15 1956 12/16/15 0550  CKTOTAL 253*  --   --   --   TROPONINI  --  0.16* 0.18* 0.08*    CBG:  Recent Labs  12/16/15 2206 12/17/15 0746 12/17/15 1156  GLUCAP 239* 273* 279*        ASSESSMENT/PLAN:  Unsteady gait - for Home health CNA, Nursing, PT and OT , for therapeutic strengthening exercises; fall precaution  Right femur fracture S/P ORIF - for Home health CNA, Nursing, PT and OT, for therapeutic strengthening exercises ; continue Norco 5/325 mg 1-2 tabs by mouth every 6 hours when necessary and  tramadol 50 mg 1 tab by mouth every 6 hours when necessary for pain; follow-up with orthopedic surgeon, Dr. Rod Can  CHF -  No SOB, with O2 @ 2L/min via [place] Continuously; continue Torsemide 20 mg 1 tab PO Q D  Chronic respiratory failure - continue O2 @ 2L/min via [place] continuously, Pulmicort, performist and Ventolin  Chronic renal failure stage IV - GFR  33.79 , stable Lab Results  Component Value Date   CREATININE 1.5 (A) 03/11/2016    Hypertension - continue labetalol 100 mg 1 tab by mouth twice a day, nifedipine ER 30 mg 1 tab by mouth twice a day  Constipation - continue senna S 1 tab by mouth twice a day  Hyperlipidemia - continue Lipitor 10 mg 1 tab by mouth daily ; check lipid panel  Anemia of chronic disease - stable;  continue Iron 325 mg 1 tab by mouth daily Lab Results  Component Value Date   HGB 8.2 (A) 01/22/2016    Diabetes mellitus, type II with renal complications - recently increased Toujeo 300 units/mL to 15 units subcutaneous daily, Onglyza 2.5 mg 1 tab by mouth daily Lab Results  Component Value Date   HGBA1C 6.3 (H) 12/09/2015    Hx of Left breast cancer - continue Anastrozole 1 mg 1 tab by mouth daily  CAD S/P stent placement in 2009 - no chest pains; continue Plavix 75 mg 1 tab by mouth daily and NTG PRN  COPD - no SOB; continue Pulmicort 0.25 mg/2 mL take 0.25 mg via nebulization daily, Performist 20 mcg/2 ml via nebulization daily, Ventolin HFA 90 mcg inhale 2 puffs into lungs Q 4 hours PRN  Hypokalemia -  continue K[place]-Con 10 meq daily Lab Results  Component Value Date   K 4.2 03/11/2016    Hyperlipidemia - continue Lipitor 10 mg 1 tab  and Omega 3 fish oil 1 capsule PO BID Lab Results  Component Value Date   CHOL 116 12/19/2015   HDL 31 (A) 12/19/2015   LDLCALC 63 12/19/2015   TRIG 113 12/19/2015    Left shoulder Pain -  continue Biofreeze gel 4% topically to left shoulder Q shift  Suspected mass on RLL - for CT scan of chest  without contrast tomorrow (03/19/16) and pulmonology consult      I have filled out patient's discharge paperwork and written prescriptions.  Patient will receive home health PT, OT, Nursing and CNA.  DME provided:  Standard Wheelchair with elevating leg rests, anti-tippers, cushion, brake extension, semi-electric hospital bed, bedside commode and O2 at 2 L/minute via [place] continuously portable (gas) and stationary  Total discharge time: Greater than 30 minutes Greater than 50% was spent in counseling and coordination of care with the patient.  Discharge time involved coordination of the discharge process with social worker, nursing staff and therapy department. Medical justification for home health services/DME verified.    Durenda Age, NP [employer] [phone]

## 2016-03-19 ENCOUNTER — Ambulatory Visit (HOSPITAL_BASED_OUTPATIENT_CLINIC_OR_DEPARTMENT_OTHER)
Admission: RE | Admit: 2016-03-19 | Discharge: 2016-03-19 | Disposition: A | Discharge status: Home / Self Care | Payer: Medicare Other | Source: Ambulatory Visit | Attending: Internal Medicine | Admitting: Internal Medicine

## 2016-03-19 DIAGNOSIS — K449 Diaphragmatic hernia without obstruction or gangrene: Secondary | ICD-10-CM | POA: Insufficient documentation

## 2016-03-19 DIAGNOSIS — E279 Disorder of adrenal gland, unspecified: Secondary | ICD-10-CM | POA: Diagnosis not present

## 2016-03-19 DIAGNOSIS — N6320 Unspecified lump in the left breast, unspecified quadrant: Secondary | ICD-10-CM | POA: Diagnosis not present

## 2016-03-19 DIAGNOSIS — R918 Other nonspecific abnormal finding of lung field: Secondary | ICD-10-CM | POA: Insufficient documentation

## 2016-03-21 ENCOUNTER — Other Ambulatory Visit: Payer: Self-pay | Admitting: Adult Health

## 2016-03-22 DIAGNOSIS — I130 Hypertensive heart and chronic kidney disease with heart failure and stage 1 through stage 4 chronic kidney disease, or unspecified chronic kidney disease: Secondary | ICD-10-CM | POA: Diagnosis not present

## 2016-03-22 DIAGNOSIS — S72401D Unspecified fracture of lower end of right femur, subsequent encounter for closed fracture with routine healing: Secondary | ICD-10-CM | POA: Diagnosis not present

## 2016-03-22 DIAGNOSIS — I509 Heart failure, unspecified: Secondary | ICD-10-CM

## 2016-03-22 DIAGNOSIS — E1122 Type 2 diabetes mellitus with diabetic chronic kidney disease: Secondary | ICD-10-CM | POA: Diagnosis not present

## 2016-04-14 ENCOUNTER — Telehealth: Payer: Self-pay | Admitting: Pulmonary Disease

## 2016-04-14 ENCOUNTER — Encounter: Payer: Self-pay | Admitting: Pulmonary Disease

## 2016-04-14 ENCOUNTER — Ambulatory Visit (INDEPENDENT_AMBULATORY_CARE_PROVIDER_SITE_OTHER): Payer: Medicare Other | Admitting: Pulmonary Disease

## 2016-04-14 VITALS — BP 163/68 | HR 63 | Ht 63.25 in | Wt 189.0 lb

## 2016-04-14 DIAGNOSIS — K449 Diaphragmatic hernia without obstruction or gangrene: Secondary | ICD-10-CM

## 2016-04-14 DIAGNOSIS — E279 Disorder of adrenal gland, unspecified: Secondary | ICD-10-CM

## 2016-04-14 DIAGNOSIS — I2510 Atherosclerotic heart disease of native coronary artery without angina pectoris: Secondary | ICD-10-CM

## 2016-04-14 DIAGNOSIS — R918 Other nonspecific abnormal finding of lung field: Secondary | ICD-10-CM | POA: Insufficient documentation

## 2016-04-14 DIAGNOSIS — E278 Other specified disorders of adrenal gland: Secondary | ICD-10-CM

## 2016-04-14 DIAGNOSIS — J449 Chronic obstructive pulmonary disease, unspecified: Secondary | ICD-10-CM

## 2016-04-14 NOTE — Patient Instructions (Addendum)
   Call me to set up your chest CT scan if you end up staying in [place].   You will need a repeat chest CT scan in February to follow-up on your lung nodules.  We will set you up for a PET CT scan to look at your abdominal mass. Make sure you get it on a disc to take back to [place].  You will need to see a Urologist in [place] or [place] depending on where you are going to be at after the holiday.  Take Zantac (Ranitidine) 150mg  before bed. Remember to avoid eating 3 hours before bed and ensure the head of your bed is elevated 3-5 inches. This should help your cough.   I will see you back as needed. Call me if you have any questions.  TESTS ORDERED: 1. PET CT scan

## 2016-04-14 NOTE — Telephone Encounter (Signed)
IMAGING CT CHEST W/O 03/19/16 (personally reviewed by me): Upper lobe tree-in-bud opacities that are patchy. Imaging somewhat degraded by motion. Multiple bilateral subcentimeter parenchymal nodules which are calcified. Largest nodule measures 8 mm in left lower lobe. No pathologic mediastinal adenopathy. No pleural effusion or thickening. No pericardial effusion. Calcified left adrenal mass measuring 4.8 x 2.9 cm noted. Cystic left breast mass measuring 2.8 x 2.7 x 2.9 cm. Moderate to large hiatal hernia.  CARDIAC TTE (12/16/15): LV normal in size with EF 55-60%. No regional wall motion abnormalities. Grade 1 diastolic dysfunction. LA & RA normal in size. RV normal in size and function. Pulmonary artery systolic pressure 35 mmHg. No aortic stenosis or regurgitation. Aortic root normal in size. Trivial mitral regurgitation without stenosis. No pulmonic regurgitation or stenosis. No tricuspid regurgitation. No pericardial effusion.

## 2016-04-14 NOTE — Progress Notes (Signed)
Subjective:    Patient ID: [name], female    DOB: 1926-03-02, 82 y.o.   MRN: [id number]  HPI She reports she previously had bronchitis and pneumonia that was treated with repeated courses of antibiotics. This prompted a CT scan of her chest showing multiple lung nodules. She was unaware of any history of lung nodules. She reports she has had previous chest imaging in 2015 during her breast cancer treatment and surgery in [place]. She reports she has continued to have dyspnea on exertion. She reports she is coughing, especially in the morning. Cough is productive of a small amount of green mucus but mostly nonproductive. No fever, chills, or sweats. She reports a sore throat with her coughing. No dysphagia or odynophagia recently. She is aware of her hiatal hernia. No reflux or dyspepsia. No morning brash water taste. No chest pain, tightness, or pressure except with her coughing spells. She has had intermittent wheezing. Her last course of antibiotics was in early October. Reports she only uses her rescue medication every other day. Sees Pulmonologist in [place].   Review of Systems No rashes. Does have easy bruising on Plavix. No sinus congestion, pressure, or drainage. A pertinent 14 point review of systems is negative except as per the history of presenting illness.  Allergies  Allergen Reactions  . Sulfa Antibiotics Other (See Comments)    Reaction unknown  . Tape Other (See Comments)    Blisters  . Penicillins Rash    Has patient had a PCN reaction causing immediate rash, facial/tongue/throat swelling, SOB or lightheadedness with hypotension: yes Has patient had a PCN reaction causing severe rash involving mucus membranes or skin necrosis: no Has patient had a PCN reaction that required hospitalization yes as a teenager Has patient had a PCN reaction occurring within the last 10 years: no was a teenager If all of the above answers are "NO", then may proceed with Cephalosporin  use.     Current Outpatient Prescriptions on File Prior to Visit  Medication Sig Dispense Refill  . anastrozole (ARIMIDEX) 1 MG tablet Take 1 mg by mouth daily.     . budesonide (PULMICORT) 0.25 MG/2ML nebulizer solution Take 0.25 mg by nebulization daily.     . clopidogrel (PLAVIX) 75 MG tablet Take 75 mg by mouth daily.     . formoterol (PERFOROMIST) 20 MCG/2ML nebulizer solution Take 20 mcg by nebulization daily.     . Insulin Glargine (TOUJEO SOLOSTAR) 300 UNIT/ML SOPN Inject 10 Units into the skin daily.     Marland Kitchen labetalol (NORMODYNE) 100 MG tablet Take 100 mg by mouth 2 (two) times daily.     . Multiple Vitamins-Minerals (OCUTABS) TABS Take 1 tablet by mouth daily.    Marland Kitchen NIFEdipine (PROCARDIA-XL/ADALAT CC) 30 MG 24 hr tablet Take 30 mg by mouth 2 (two) times daily.   1  . nitroGLYCERIN (NITROSTAT) 0.4 MG SL tablet Place 0.4 mg under the tongue every 5 (five) minutes as needed for chest pain.    . OXYGEN Inhale 2 L/min into the lungs as needed (To maintain O2 sats >92%).    . Polyvinyl Alcohol-Povidone (REFRESH OP) Place 1 drop into both eyes 2 (two) times daily.     . potassium chloride (K-DUR,KLOR-CON) 10 MEQ tablet Take 10 mEq by mouth daily.    . saxagliptin HCl (ONGLYZA) 2.5 MG TABS tablet Take 2.5 mg by mouth daily.     Marland Kitchen senna-docusate (SENOKOT-S) 8.6-50 MG tablet Take 1 tablet by mouth 2 (two) times daily.  30 tablet   . traMADol (ULTRAM) 50 MG tablet Take 1 tablet (50 mg total) by mouth every 6 (six) hours as needed for moderate pain. 30 tablet 0  . VENTOLIN HFA 108 (90 Base) MCG/ACT inhaler Inhale 2 puffs into the lungs every 4 (four) hours as needed for wheezing or shortness of breath.   0   No current facility-administered medications on file prior to visit.     Past Medical History:  Diagnosis Date  . AKI (acute kidney injury) ([place])   . Anemia   . Arthritis   . Asthma   . Cancer [facility])    breast - left  . Chronic kidney disease (CKD), stage IV (severe) ([place])   . COPD  (chronic obstructive pulmonary disease) ([place])   . Diabetes mellitus without complication ([place])    Type II  . Dyslipidemia   . Elevated troponin   . Fall 12/2015  . Fracture of femoral neck, closed ([place]) 01/2016   Right, due to mechanical fall  . Hypertension     Past Surgical History:  Procedure Laterality Date  . ABDOMINAL HYSTERECTOMY  1975  . APPENDECTOMY  1947  . BREAST LUMPECTOMY Left 2015  . CATARACT EXTRACTION    . CHOLECYSTECTOMY    . FEMORAL ARTERY STENT     x2  . REPLACEMENT TOTAL KNEE Right   . STENT PLACEMENT VASCULAR ([place] [facility])     2 in chest  . TONSILLECTOMY  1934  . TOTAL KNEE ARTHROPLASTY Right 12/09/2015   Procedure: ORIF OF DISTAL FEMUR;  Surgeon: Rod Can, MD;  Location: [facility];  Service: Orthopedics;  Laterality: Right;    Family History  Problem Relation Age of Onset  . Lung cancer Sister   . Hypertension Mother   . Heart disease Mother   . Lung disease Neg Hx     Social History   Social History  . Marital status: Single    Spouse name: N/A  . Number of children: N/A  . Years of education: N/A   Social History Main Topics  . Smoking status: Former Smoker    Packs/day: 0.25    Years: 36.00    Types: Cigarettes    Start date: 03/08/1963    Quit date: 05/10/1998  . Smokeless tobacco: Never Used  . Alcohol use Yes     Comment: rarely  . Drug use: No  . Sexual activity: No   Other Topics Concern  . None   Social History Narrative   Lives in [place]. Lives alone.       [facility]:   Originally from [facility]. Currently lives in [place]. Currently works doing Personal assistant, taxes, and Press photographer. Has a degree in accounting. Living with daughter currently. Daughter has a dog. No mold or bird exposure. No recent hot tub exposure.       Objective:   Physical Exam BP (!) 163/68 (BP Location: Right Arm, Cuff Size: Normal)   Pulse 63   Ht 5' 3.25" (1.607 m)   Wt 189 lb (85.7 kg)   SpO2 93%   BMI 33.22 kg/m  General:  Awake. Alert. No  acute distress. Elderly female.  Integument:  Warm & dry. No rash on exposed skin. Bruising of various ages on exposed skin. Lymphatics:  No appreciated cervical or supraclavicular lymphadenoapthy. HEENT:  Moist mucus membranes. No oral ulcers. No scleral injection or icterus.  Cardiovascular:  Regular rate. Trace lower extremity edema. Normal S1 & S2. Pulmonary:  Good aeration & clear to  auscultation bilaterally. Symmetric chest wall expansion. No accessory muscle use on room air. Abdomen: Soft. Normal bowel sounds. Nondistended.  Musculoskeletal:  Normal bulk and tone. Hand grip strength 5/5 bilaterally. No joint effusion appreciated. Neurological:  CN 2-12 grossly in tact. No meningismus. Moving all 4 extremities equally. Symmetric brachioradialis deep tendon reflexes. Psychiatric:  Mood and affect congruent. Speech normal rhythm, rate & tone.   IMAGING CT CHEST W/O 03/19/16 (personally reviewed by me): Upper lobe tree-in-bud opacities that are patchy. Imaging somewhat degraded by motion. Multiple bilateral subcentimeter parenchymal nodules which are calcified. Largest nodule measures 8 mm in left lower lobe. No pathologic mediastinal adenopathy. No pleural effusion or thickening. No pericardial effusion. Calcified left adrenal mass measuring 4.8 x 2.9 cm noted. Cystic left breast mass measuring 2.8 x 2.7 x 2.9 cm. Moderate to large hiatal hernia.  CARDIAC TTE (12/16/15): LV normal in size with EF 55-60%. No regional wall motion abnormalities. Grade 1 diastolic dysfunction. LA & RA normal in size. RV normal in size and function. Pulmonary artery systolic pressure 35 mmHg. No aortic stenosis or regurgitation. Aortic root normal in size. Trivial mitral regurgitation without stenosis. No pulmonic regurgitation or stenosis. No tricuspid regurgitation. No pericardial effusion.    Assessment & Plan:  82 y.o. female with multiple bilateral lung nodules as well as hiatal hernia noted on chest CT  imaging. Suspect patient's cough is secondary to silent laryngo-esophageal reflux from her hiatal hernia. Lung nodules are likely benign but with her history of breast cancer as well as her left adrenal mass follow-up CT imaging is necessary. I am concerned that her left adrenal mass could be malignant in etiology. This will need to be assessed with PET/CT imaging. I'm holding off on a referral to urology depending upon where the patient will be at after her 12/26 appointment with orthopedic surgery. I instructed the patient contact my office if she had any further questions or concerns.  1. Multiple Lung Nodules:  Plan for repeat CT chest without contrast in February. Patient will contact me if this needs to be scheduled in [place]. 2. Hiatal Hernia:  Patient counseled on appropriate lifestyle modifications as well as to elevate the head of her bed. Recommended to start on Zantac 150 mg by mouth daily at bedtime before bedtime. 3. Left Adrenal Mass:  Checking PET CT. Referring patient to urology if she stays in [place] otherwise she will contact her PCP in [place] for a referral. 4. COPD with Asthma:  Continuing Budesonide & Perforomist nebulized twice daily.  5. Health Maintenance:  S/P Influenza Vaccine September 2017. 6. Follow-up:  Return to clinic in February after CT scan if she remains in [place].  Sonia Baller Ashok Cordia, M.D. [facility] Pager:  [phone] After 3pm or if no response, call [phone] 3:39 PM 04/14/16

## 2016-04-20 ENCOUNTER — Other Ambulatory Visit: Payer: Self-pay | Admitting: Adult Health

## 2016-04-23 ENCOUNTER — Encounter (HOSPITAL_COMMUNITY)
Admission: RE | Admit: 2016-04-23 | Discharge: 2016-04-23 | Disposition: A | Discharge status: Home / Self Care | Payer: Medicare Other | Source: Ambulatory Visit | Attending: Pulmonary Disease | Admitting: Pulmonary Disease

## 2016-04-23 DIAGNOSIS — E278 Other specified disorders of adrenal gland: Secondary | ICD-10-CM

## 2016-04-23 DIAGNOSIS — E279 Disorder of adrenal gland, unspecified: Secondary | ICD-10-CM | POA: Insufficient documentation

## 2016-04-23 LAB — GLUCOSE, CAPILLARY: Glucose-Capillary: 185 mg/dL — ABNORMAL HIGH (ref 65–99)

## 2016-04-23 MED ORDER — FLUDEOXYGLUCOSE F - 18 (FDG) INJECTION
9.1000 | Freq: Once | INTRAVENOUS | Status: AC | PRN
  Administered 2016-04-23: 9.1 via INTRAVENOUS

## 2016-04-24 ENCOUNTER — Other Ambulatory Visit: Payer: Self-pay | Admitting: Adult Health

## 2016-04-30 ENCOUNTER — Telehealth: Payer: Self-pay | Admitting: Pulmonary Disease

## 2016-04-30 NOTE — Progress Notes (Signed)
See phone note dated 04/30/16

## 2016-04-30 NOTE — Telephone Encounter (Signed)
Notes Recorded by Javier Glazier, MD on 04/23/2016 at 10:49 AM EST Please inform the patient that her PET CT scan does show hypermetabolic activity in her left adrenal mass which could indicate a cancer. There are no other areas of hypermetabolic activity that would suggest cancer in other places of her body. She is already aware that I recommended an evaluation by urology for surgical removal and will be requesting a consultation here and during oral or will be obtaining one in [place] if she ends up returning depending upon upcoming appointment. Thank you.  LMTCB

## 2016-05-04 NOTE — Telephone Encounter (Signed)
[phone] is the correct number to call  Called again and [facility]

## 2016-05-04 NOTE — Telephone Encounter (Signed)
Pt returning call to get results of test.[name] ° °

## 2016-05-05 NOTE — Telephone Encounter (Signed)
Attempted to contact pt. No answer, no option to leave a message. Will try back.  

## 2016-05-05 NOTE — Telephone Encounter (Signed)
I have attempted to call the pt again with no answer.  Will try back later.

## 2016-05-05 NOTE — Telephone Encounter (Signed)
Patient is returning phone call.  °

## 2016-05-05 NOTE — Telephone Encounter (Signed)
Pt returning call.[name] ° °

## 2016-05-06 NOTE — Telephone Encounter (Signed)
lmtcb x1 for pt. 

## 2016-05-10 NOTE — Telephone Encounter (Signed)
lmomtcb x1 

## 2016-05-12 NOTE — Telephone Encounter (Signed)
lmtcb x2 for pt. 

## 2016-05-13 ENCOUNTER — Telehealth: Payer: Self-pay | Admitting: Pulmonary Disease

## 2016-05-13 NOTE — Telephone Encounter (Signed)
lmtcb x1 for pt. 

## 2016-05-13 NOTE — Telephone Encounter (Signed)
Spoke with pt. She is aware of results. States that she is currently back in [place], [place]. Pt has an appointment with her PCP to discuss this. Nothing further was needed.

## 2016-05-13 NOTE — Telephone Encounter (Signed)
lmtcb x3 for pt. 

## 2016-05-14 NOTE — Telephone Encounter (Signed)
Patient called and an oncologist in [facility] is wanting ov notes of JN and PET scan report to be sent. Dr. Posey Pronto Phone # [phone], fax# [phone] Attn: Amy. Patient can be reached at [phone]

## 2016-05-14 NOTE — Telephone Encounter (Signed)
lmtcb x2 for pt. 

## 2016-05-14 NOTE — Telephone Encounter (Signed)
PET scan results have been faxed to the designated fax number. Pt is aware of this. Nothing further was needed.

## 2016-11-18 IMAGING — CT CT CHEST W/O CM
2 of 3 series · 14 of 36 positions shown, 17 images · non-contrast
Comparison: CXR exams from 12/14/2015 and 12/09/2015

CLINICAL DATA: Chronic cough and congestion, history of asthma.

EXAM:
CT CHEST WITHOUT CONTRAST
TECHNIQUE: Multidetector CT imaging of the chest was performed following the
standard protocol without IV contrast.

[Series 2: thorax · axial · 0.78mm/px · z∈[-293,-45]mm · 11 of 146 slices shown, 14 images]
[im 11/146  mediastinal]
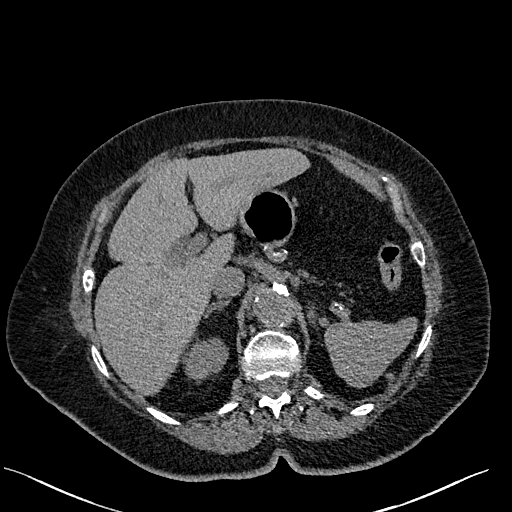
[im 11/146  lung]
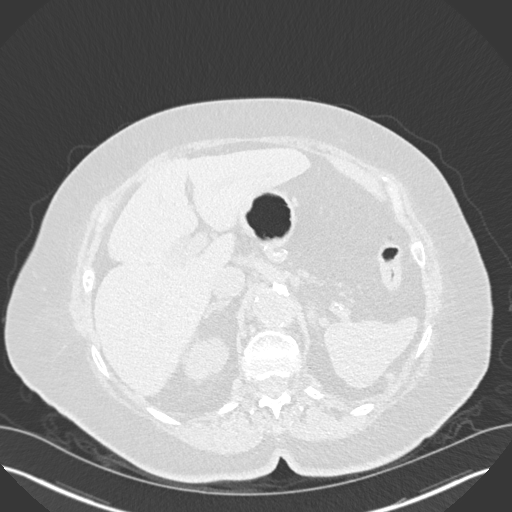
[im 22/146  lung]
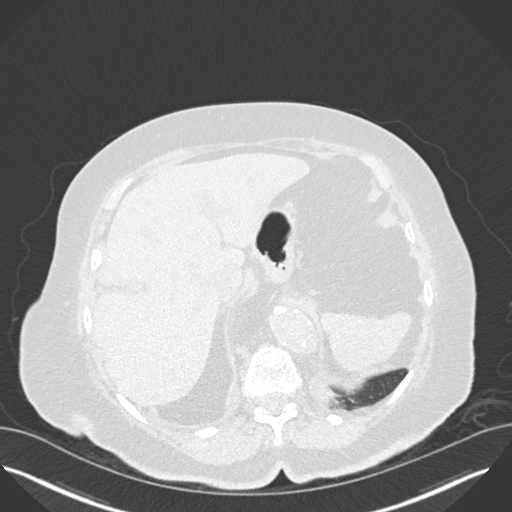
[im 33/146  lung]
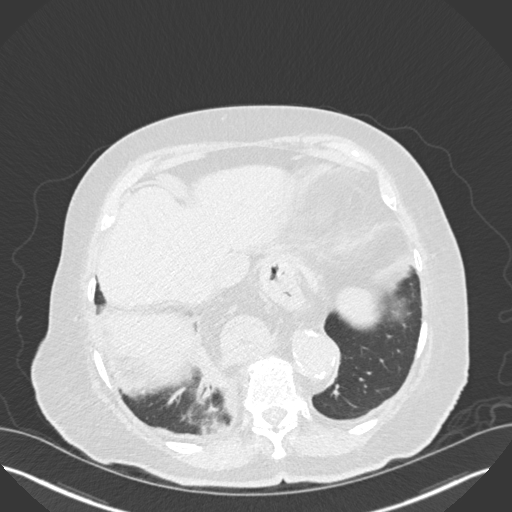
[im 49/146  lung]
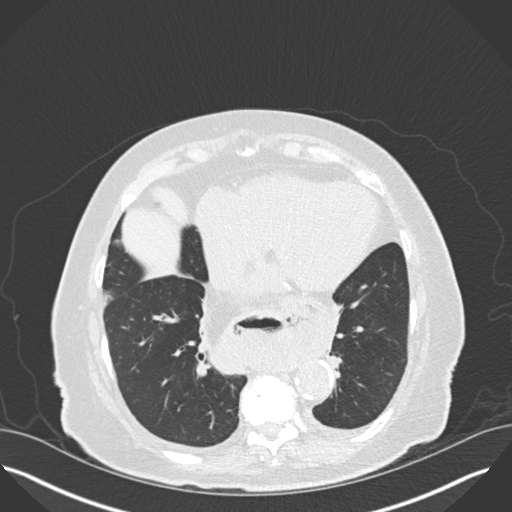
[im 60/146  mediastinal]
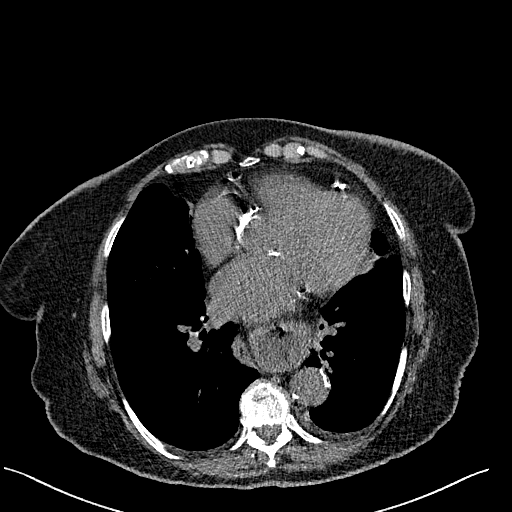
[im 60/146  lung]
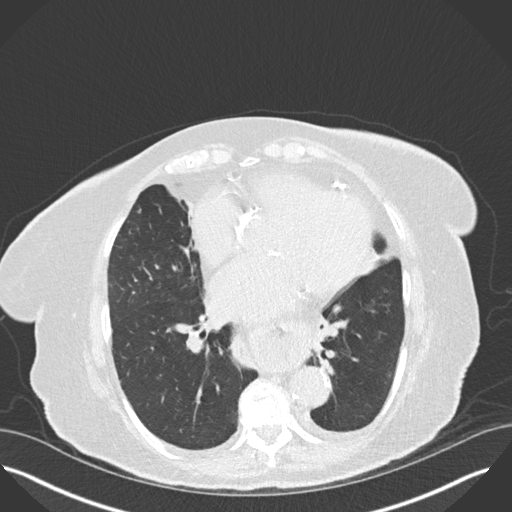
[im 76/146  lung]
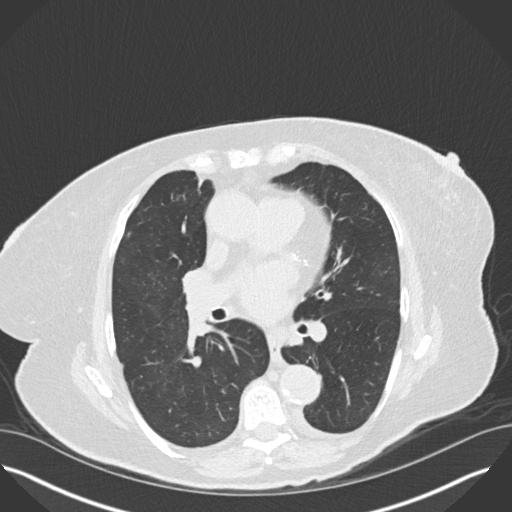
[im 86/146  lung]
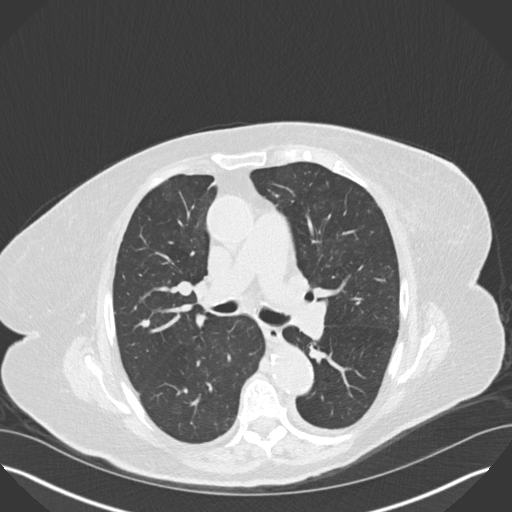
[im 97/146  lung]
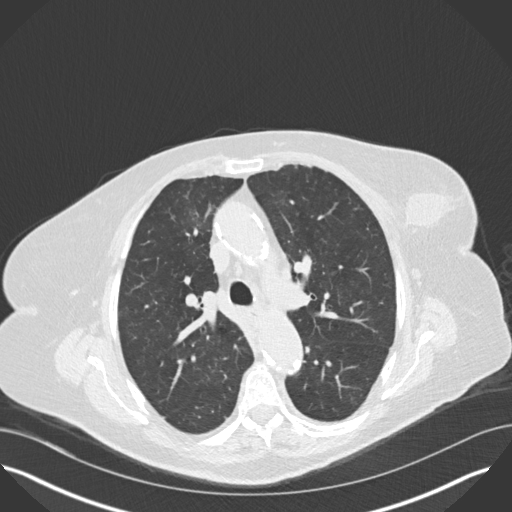
[im 113/146  mediastinal]
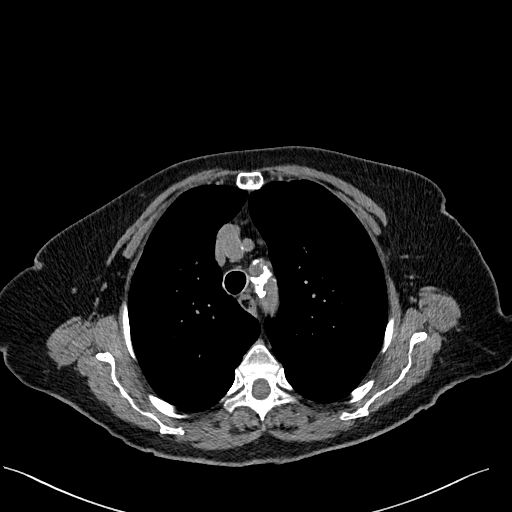
[im 113/146  lung]
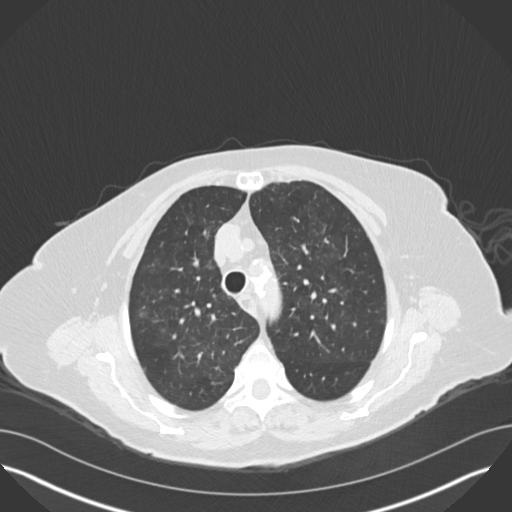
[im 124/146  lung]
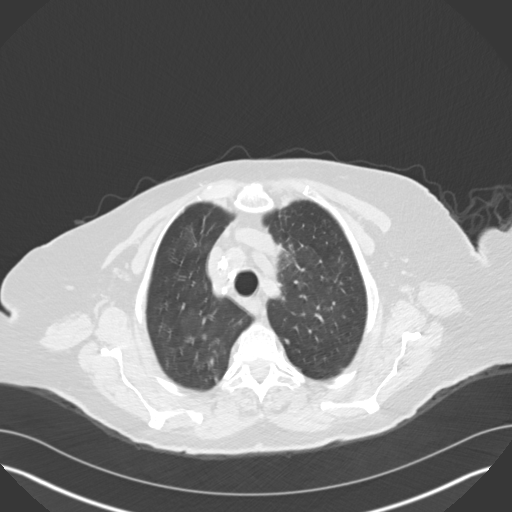
[im 135/146  lung]
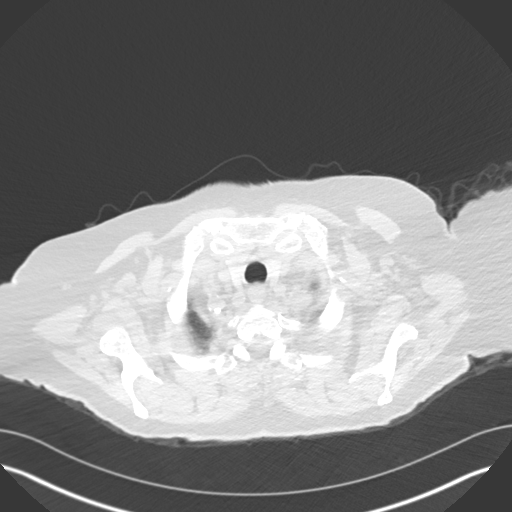

[Series 5: coronal · coronal · 0.61mm/px · 3 of 139 slices shown]
[im 28/139  lung]
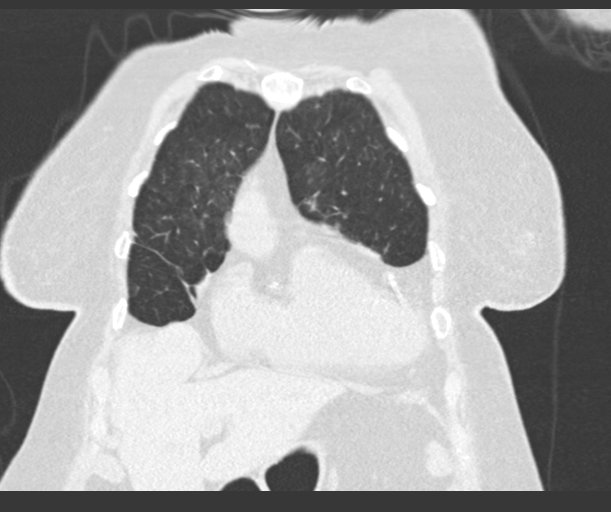
[im 56/139  lung]
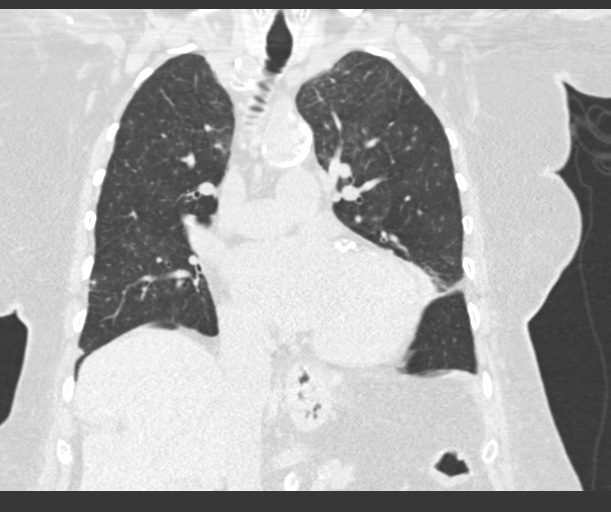
[im 83/139  lung]
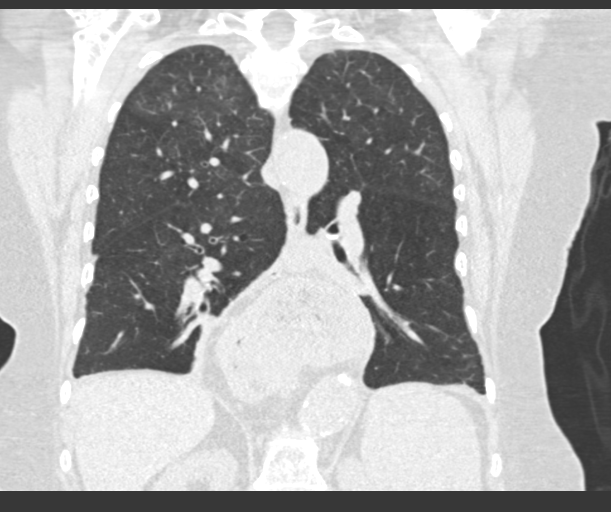

[14 of 36 positions shown; findings below may reference images not displayed]

FINDINGS: Cardiovascular: There is cardiomegaly with 3 vessel coronary
arteriosclerosis and trace pericardial effusion and/or thickening.
There is aortic atherosclerosis with normal 3 vessel takeoff from
the aortic arch also demonstrating atherosclerosis.

Mediastinum/Nodes: No supraclavicular, axillary or mediastinal
lymphadenopathy. There is a moderate to large hiatal hernia. The
tracheobronchial tree appears patent. The esophagus is unremarkable.

Lungs/Pleura: Bilateral tree-in-bud opacities are noted in the upper
lobes, series 3 image 32, 61 and right middle lobe image 79. These
may represent areas of bronchiolitis. Subpleural areas of
atelectasis noted anteriorly in the left upper lobe and superior
segment right lower lobe, series 3, image 54 and 67.

Bilateral pulmonary nodules are noted with index nodules in the left
lower lobe series 3, image 108 measuring 8.1 x 5.6 mm (average
mm), right upper lobe image 61 measuring 4.9 x 4.7 mm (average
mm) are and right lower lobe series 3, image 108 measuring 5.5 x
mm ( average of 4.3 mm). There are least 4 or more right upper lobe
and 2 or more left upper lobe nodules on the order of 3-4 mm each. A
calcified granuloma is noted in the right upper lobe image 80. No
pulmonary consolidation or pneumothorax.

Upper Abdomen: There is a left adrenal mass with 3 mm calcification
posteromedially overall measuring 4.8 x 2.9 cm. The right adrenal
gland is normal. No space-occupying mass of the visualized liver.
The pancreas and spleen are unremarkable. There is a punctate
calcification associated with the wall of the right kidney.

Musculoskeletal: Left breast cystic mass measuring 2.8 x 2.7 x
cm possibly representing postsurgical change or breast cyst.
Clinical correlation and if needed additional imaging is recommended
for further correlation if clinically warranted. Patient's past
medical history indicates breast lumpectomy on the left in 7099
which this may be associated with. Lower thoracic degenerative disc
disease. No acute osseous abnormality. No apparent lytic or blastic
abnormality.
IMPRESSION: 1. Multiple calcified and noncalcified pulmonary nodules noted
bilaterally, the largest averaging 6.9 mm in the left lower lobe. A
Non-contrast chest CT at 3-6 months is recommended. If the nodules
are stable at time of repeat CT, then future CT at 18-24 months
(from today's scan) is considered optional for low-risk patients,
but is recommended for high-risk patients. This recommendation
follows the consensus statement: Guidelines for Management of
Incidental Pulmonary Nodules Detected on CT Images: From the
2. Tree-in-bud densities in the upper lobes and right middle lobe
which can be seen in bronchiolitis/ small airway disease.
3. Left adrenal mass with calcification overall measuring 4.8 x
cm. Hounsfield units are not classic for adenoma. CT or MRI with
contrast for further correlation.
4. Cystic left breast mass measuring 2.8 x 2.7 x 2.9 cm possibly as
a result of prior lumpectomy in 7099 with correlation with recent
mammographic and ultrasound findings would prove useful.
5. Moderate to large hiatal hernia.

## 2019-03-04 DEATH — deceased
